# Patient Record
Sex: Male | Born: 1995 | Race: Black or African American | Hispanic: No | Marital: Single | Smoking: Never smoker
Health system: Southern US, Community
[De-identification: ages and names within clinical notes are randomized; demographics above are authoritative.]

---
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
## 2018-11-15 ENCOUNTER — Encounter (HOSPITAL_COMMUNITY): Payer: Self-pay | Admitting: Emergency Medicine

## 2018-11-15 ENCOUNTER — Ambulatory Visit (HOSPITAL_COMMUNITY)
Admission: EM | Admit: 2018-11-15 | Discharge: 2018-11-15 | Disposition: A | Discharge status: Home / Self Care | Payer: Self-pay | Attending: Family Medicine | Admitting: Family Medicine

## 2018-11-15 DIAGNOSIS — J320 Chronic maxillary sinusitis: Secondary | ICD-10-CM | POA: Insufficient documentation

## 2018-11-15 MED ORDER — AZITHROMYCIN 250 MG PO TABS: 250.0000 mg | ORAL_TABLET | Freq: Once | ORAL | 1 refills | Status: AC

## 2018-11-15 NOTE — ED Provider Notes (Signed)
[facility]    CSN: [id number] Arrival date & time: 11/15/18  1728     History   Chief Complaint Chief Complaint  Patient presents with  . URI    appt 1745    HPI Adric Wrede is a 22 y.o. male.  Patient complains of sinus pain and pressure, postnasal drainage and cough productive of dark yellow sputum.  Symptoms are improved with hot shower.  He has been taking some Claritin.   HPI  Past Medical History:  Diagnosis Date  . Asthma   . Migraine     There are no active problems to display for this patient.   History reviewed. No pertinent surgical history.     Home Medications    Prior to Admission medications   Medication Sig Start Date End Date Taking? Authorizing Provider  albuterol (PROVENTIL,VENTOLIN) 90 MCG/ACT inhaler Inhale 1-2 puffs into the lungs every 4 (four) hours as needed.      [provider]  budesonide-formoterol (SYMBICORT) 160-4.5 MCG/ACT inhaler Inhale 2 puffs into the lungs 2 (two) times daily.      [provider]  desloratadine (CLARINEX) 5 MG tablet Take 5 mg by mouth daily.      [provider]  diphenhydramine-acetaminophen (TYLENOL PM) 25-500 MG TABS Take 1 tablet by mouth at bedtime as needed.    [provider]  levalbuterol Pauline Aus) 0.63 MG/3ML nebulizer solution Take 1 ampule by nebulization every 4 (four) hours as needed.      [provider]  mometasone (NASONEX) 50 MCG/ACT nasal spray Place 2 sprays into the nose daily.      [provider]  mupirocin ointment (BACTROBAN) 2 % Apply topically 3 (three) times daily. 08/07/13   Reuben Likes, MD  sulfamethoxazole-trimethoprim (BACTRIM DS) 800-160 MG per tablet Take 2 tablets by mouth 2 (two) times daily. Patient not taking: Reported on 11/15/2018 08/07/13   Reuben Likes, MD    Family History History reviewed. No pertinent family history.  Social History Social History   Tobacco Use  . Smoking status: Never  Smoker  . Smokeless tobacco: Never Used  Substance Use Topics  . Alcohol use: No  . Drug use: No     Allergies   Penicillins   Review of Systems Review of Systems  Constitutional: Negative.   HENT: Positive for congestion, sinus pressure, sinus pain and sore throat.   Respiratory: Positive for cough.   Cardiovascular: Negative.   Gastrointestinal: Negative.      Physical Exam Triage Vital Signs ED Triage Vitals  Enc Vitals Group     BP 11/15/18 1754 (!) 146/84     Pulse Rate 11/15/18 1754 (!) 104     Resp 11/15/18 1754 18     Temp 11/15/18 1754 (!) 100.6 F (38.1 C)     Temp Source 11/15/18 1754 Oral     SpO2 11/15/18 1754 96 %     Weight --      Height --      Head Circumference --      Peak Flow --      Pain Score 11/15/18 1755 5     Pain Loc --      Pain Edu? --      Excl. in GC? --    No data found.  Updated Vital Signs BP (!) 146/84 (BP Location: Right Arm)   Pulse (!) 104   Temp (!) 100.6 F (38.1 C) (Oral)   Resp 18   SpO2  96%   Visual Acuity Right Eye Distance:   Left Eye Distance:   Bilateral Distance:    Right Eye Near:   Left Eye Near:    Bilateral Near:     Physical Exam Vitals signs and nursing note reviewed.  Constitutional:      Appearance: Normal appearance. He is obese.  HENT:     Head: Normocephalic.     Comments: There is maxillary sinus tenderness bilaterally    Right Ear: Tympanic membrane normal.     Left Ear: Tympanic membrane normal.     Nose: Nose normal.     Mouth/Throat:     Mouth: Mucous membranes are moist.  Cardiovascular:     Rate and Rhythm: Normal rate.  Pulmonary:     Effort: Pulmonary effort is normal.     Breath sounds: Normal breath sounds.  Neurological:     Mental Status: He is alert.      UC Treatments / Results  Labs (all labs ordered are listed, but only abnormal results are displayed) Labs Reviewed - No data to display  EKG None  Radiology No results found.  Procedures Procedures  (including critical care time)  Medications Ordered in UC Medications - No data to display  Initial Impression / Assessment and Plan / UC Course  I have reviewed the triage vital signs and the nursing notes.  Pertinent labs & imaging results that were available during my care of the patient were reviewed by me and considered in my medical decision making (see chart for details).     Maxillary sinusitis.  Have recommended plenty of fluids hot showers discontinue Claritin.  Begin Mucinex Final Clinical Impressions(s) / UC Diagnoses   Final diagnoses:  None   Discharge Instructions   None    ED Prescriptions    None     Controlled Substance Prescriptions [place] Controlled Substance Registry consulted? No   Frederica KusterMiller, Stephen M, MD 11/15/18 1806

## 2018-11-15 NOTE — ED Triage Notes (Signed)
Pt here for chills and body aches; pt noted to have fever currently; pt sts URI sx

## 2019-06-12 ENCOUNTER — Other Ambulatory Visit: Payer: Self-pay | Admitting: *Deleted

## 2019-06-12 DIAGNOSIS — Z20822 Contact with and (suspected) exposure to covid-19: Secondary | ICD-10-CM

## 2019-06-13 NOTE — Addendum Note (Signed)
Addended by: Calina Patrie M on: 06/13/2019 09:01 PM   Modules accepted: Orders  

## 2021-08-24 ENCOUNTER — Encounter (HOSPITAL_BASED_OUTPATIENT_CLINIC_OR_DEPARTMENT_OTHER): Payer: Self-pay | Admitting: Emergency Medicine

## 2021-08-24 ENCOUNTER — Emergency Department (HOSPITAL_BASED_OUTPATIENT_CLINIC_OR_DEPARTMENT_OTHER)
Admission: EM | Admit: 2021-08-24 | Discharge: 2021-08-24 | Disposition: A | Discharge status: Home / Self Care | Payer: Managed Care, Other (non HMO) | Attending: Emergency Medicine | Admitting: Emergency Medicine

## 2021-08-24 ENCOUNTER — Emergency Department (HOSPITAL_BASED_OUTPATIENT_CLINIC_OR_DEPARTMENT_OTHER): Payer: Managed Care, Other (non HMO)

## 2021-08-24 ENCOUNTER — Other Ambulatory Visit: Payer: Self-pay

## 2021-08-24 DIAGNOSIS — S60812A Abrasion of left wrist, initial encounter: Secondary | ICD-10-CM | POA: Diagnosis not present

## 2021-08-24 DIAGNOSIS — S60512A Abrasion of left hand, initial encounter: Secondary | ICD-10-CM

## 2021-08-24 DIAGNOSIS — J45909 Unspecified asthma, uncomplicated: Secondary | ICD-10-CM | POA: Diagnosis not present

## 2021-08-24 DIAGNOSIS — S60414A Abrasion of right ring finger, initial encounter: Secondary | ICD-10-CM | POA: Diagnosis not present

## 2021-08-24 NOTE — ED Notes (Signed)
EDP at Bedside 

## 2021-08-24 NOTE — ED Notes (Signed)
This RN presented the AVS utilizing Teachback Method. Patient verbalizes understanding of Discharge Instructions. Opportunity for Questioning and Answers were provided. Patient Discharged from ED ambulatory to Home with Mother.   

## 2021-08-24 NOTE — Discharge Instructions (Addendum)
Use Neosporin or triple antibiotic ointment to your abrasions.  Please keep clean.  You may gently cleanse with soap and water.  Monitor the area for signs of infection which includes redness or swelling to the area.
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
## 2022-07-05 ENCOUNTER — Emergency Department (HOSPITAL_BASED_OUTPATIENT_CLINIC_OR_DEPARTMENT_OTHER): Payer: Self-pay | Admitting: Anesthesiology

## 2022-07-05 ENCOUNTER — Encounter (HOSPITAL_BASED_OUTPATIENT_CLINIC_OR_DEPARTMENT_OTHER): Payer: Self-pay | Admitting: *Deleted

## 2022-07-05 ENCOUNTER — Emergency Department (HOSPITAL_BASED_OUTPATIENT_CLINIC_OR_DEPARTMENT_OTHER): Payer: Self-pay

## 2022-07-05 ENCOUNTER — Emergency Department (HOSPITAL_COMMUNITY): Payer: Self-pay | Admitting: Anesthesiology

## 2022-07-05 ENCOUNTER — Encounter (HOSPITAL_BASED_OUTPATIENT_CLINIC_OR_DEPARTMENT_OTHER): Payer: Self-pay | Admitting: Student

## 2022-07-05 ENCOUNTER — Ambulatory Visit (HOSPITAL_BASED_OUTPATIENT_CLINIC_OR_DEPARTMENT_OTHER)
Admission: EM | Admit: 2022-07-05 | Discharge: 2022-07-05 | Disposition: A | Discharge status: Home / Self Care | Payer: Self-pay | Attending: Surgery | Admitting: Surgery

## 2022-07-05 ENCOUNTER — Encounter (HOSPITAL_COMMUNITY)
Admission: EM | Disposition: A | Discharge status: Home / Self Care | Payer: Self-pay | Source: Home / Self Care | Attending: Emergency Medicine

## 2022-07-05 ENCOUNTER — Ambulatory Visit: Admit: 2022-07-05 | Payer: Medicaid Other | Admitting: Surgery

## 2022-07-05 ENCOUNTER — Other Ambulatory Visit: Payer: Self-pay

## 2022-07-05 DIAGNOSIS — K3589 Other acute appendicitis without perforation or gangrene: Secondary | ICD-10-CM | POA: Insufficient documentation

## 2022-07-05 DIAGNOSIS — Z6838 Body mass index (BMI) 38.0-38.9, adult: Secondary | ICD-10-CM | POA: Insufficient documentation

## 2022-07-05 DIAGNOSIS — E669 Obesity, unspecified: Secondary | ICD-10-CM | POA: Insufficient documentation

## 2022-07-05 DIAGNOSIS — R1031 Right lower quadrant pain: Secondary | ICD-10-CM

## 2022-07-05 DIAGNOSIS — K3580 Unspecified acute appendicitis: Secondary | ICD-10-CM

## 2022-07-05 DIAGNOSIS — R112 Nausea with vomiting, unspecified: Secondary | ICD-10-CM

## 2022-07-05 DIAGNOSIS — J45909 Unspecified asthma, uncomplicated: Secondary | ICD-10-CM | POA: Insufficient documentation

## 2022-07-05 HISTORY — PX: LAPAROSCOPIC APPENDECTOMY: SHX408

## 2022-07-05 LAB — CBC WITH DIFFERENTIAL/PLATELET
Abs Immature Granulocytes: 0.05 10*3/uL (ref 0.00–0.07)
Basophils Absolute: 0 10*3/uL (ref 0.0–0.1)
Basophils Relative: 0 %
Eosinophils Absolute: 0 10*3/uL (ref 0.0–0.5)
Eosinophils Relative: 0 %
HCT: 40.7 % (ref 39.0–52.0)
Hemoglobin: 14.2 g/dL (ref 13.0–17.0)
Immature Granulocytes: 0 %
Lymphocytes Relative: 7 %
Lymphs Abs: 0.9 10*3/uL (ref 0.7–4.0)
MCH: 31 pg (ref 26.0–34.0)
MCHC: 34.9 g/dL (ref 30.0–36.0)
MCV: 88.9 fL (ref 80.0–100.0)
Monocytes Absolute: 0.7 10*3/uL (ref 0.1–1.0)
Monocytes Relative: 5 %
Neutro Abs: 11.9 10*3/uL — ABNORMAL HIGH (ref 1.7–7.7)
Neutrophils Relative %: 88 %
Platelets: 279 10*3/uL (ref 150–400)
RBC: 4.58 MIL/uL (ref 4.22–5.81)
RDW: 11.4 % — ABNORMAL LOW (ref 11.5–15.5)
WBC: 13.6 10*3/uL — ABNORMAL HIGH (ref 4.0–10.5)
nRBC: 0 % (ref 0.0–0.2)

## 2022-07-05 LAB — BASIC METABOLIC PANEL
Anion gap: 7 (ref 5–15)
BUN: 9 mg/dL (ref 6–20)
CO2: 25 mmol/L (ref 22–32)
Calcium: 9 mg/dL (ref 8.9–10.3)
Chloride: 107 mmol/L (ref 98–111)
Creatinine, Ser: 0.84 mg/dL (ref 0.61–1.24)
GFR, Estimated: >60 mL/min (ref >60)
Glucose, Bld: 106 mg/dL — ABNORMAL HIGH (ref 70–99)
Potassium: 3.3 mmol/L — ABNORMAL LOW (ref 3.5–5.1)
Sodium: 139 mmol/L (ref 135–145)

## 2022-07-05 LAB — URINALYSIS, ROUTINE W REFLEX MICROSCOPIC
Glucose, UA: NEGATIVE mg/dL
Hgb urine dipstick: NEGATIVE
Ketones, ur: >=80 mg/dL — AB
Leukocytes,Ua: NEGATIVE
Nitrite: NEGATIVE
Protein, ur: NEGATIVE mg/dL
Specific Gravity, Urine: 1.025 (ref 1.005–1.030)
pH: 7 (ref 5.0–8.0)

## 2022-07-05 LAB — LIPASE, BLOOD: Lipase: 35 U/L (ref 11–51)

## 2022-07-05 SURGERY — APPENDECTOMY, LAPAROSCOPIC
Anesthesia: General | Site: Abdomen

## 2022-07-05 MED ORDER — FENTANYL CITRATE PF 50 MCG/ML IJ SOSY
50.0000 ug | PREFILLED_SYRINGE | Freq: Once | INTRAMUSCULAR | Status: AC
  Administered 2022-07-05: 50 ug via INTRAVENOUS
  Filled 2022-07-05: qty 1

## 2022-07-05 MED ORDER — METRONIDAZOLE 500 MG/100ML IV SOLN
500.0000 mg | Freq: Once | INTRAVENOUS | Status: AC
  Administered 2022-07-05: 500 mg via INTRAVENOUS
  Filled 2022-07-05: qty 100

## 2022-07-05 MED ORDER — ONDANSETRON HCL 4 MG/2ML IJ SOLN
INTRAMUSCULAR | Status: DC | PRN
  Administered 2022-07-05: 4 mg via INTRAVENOUS

## 2022-07-05 MED ORDER — PROPOFOL 10 MG/ML IV BOLUS
INTRAVENOUS | Status: AC
  Filled 2022-07-05: qty 20

## 2022-07-05 MED ORDER — FENTANYL CITRATE (PF) 100 MCG/2ML IJ SOLN
INTRAMUSCULAR | Status: AC
  Filled 2022-07-05: qty 2

## 2022-07-05 MED ORDER — ACETAMINOPHEN 500 MG PO TABS
1000.0000 mg | ORAL_TABLET | Freq: Once | ORAL | Status: AC
  Administered 2022-07-05: 1000 mg via ORAL
  Filled 2022-07-05: qty 2

## 2022-07-05 MED ORDER — ROCURONIUM BROMIDE 10 MG/ML (PF) SYRINGE
PREFILLED_SYRINGE | INTRAVENOUS | Status: AC
  Filled 2022-07-05: qty 10

## 2022-07-05 MED ORDER — ONDANSETRON HCL 4 MG/2ML IJ SOLN
INTRAMUSCULAR | Status: AC
  Filled 2022-07-05: qty 2

## 2022-07-05 MED ORDER — SUGAMMADEX SODIUM 500 MG/5ML IV SOLN
INTRAVENOUS | Status: DC | PRN
  Administered 2022-07-05: 300 mg via INTRAVENOUS

## 2022-07-05 MED ORDER — 0.9 % SODIUM CHLORIDE (POUR BTL) OPTIME
TOPICAL | Status: DC | PRN
  Administered 2022-07-05: 1000 mL

## 2022-07-05 MED ORDER — BUPIVACAINE-EPINEPHRINE 0.25% -1:200000 IJ SOLN
INTRAMUSCULAR | Status: DC | PRN
  Administered 2022-07-05: 30 mL

## 2022-07-05 MED ORDER — OXYCODONE HCL 5 MG/5ML PO SOLN: 5.0000 mg | Freq: Once | ORAL | Status: DC | PRN

## 2022-07-05 MED ORDER — METRONIDAZOLE 500 MG/100ML IV SOLN
INTRAVENOUS | Status: DC | PRN
  Administered 2022-07-05: 500 mg via INTRAVENOUS

## 2022-07-05 MED ORDER — MIDAZOLAM HCL 2 MG/2ML IJ SOLN
INTRAMUSCULAR | Status: AC
  Filled 2022-07-05: qty 2

## 2022-07-05 MED ORDER — BUPIVACAINE-EPINEPHRINE (PF) 0.25% -1:200000 IJ SOLN
INTRAMUSCULAR | Status: AC
  Filled 2022-07-05: qty 30

## 2022-07-05 MED ORDER — SUCCINYLCHOLINE CHLORIDE 200 MG/10ML IV SOSY
PREFILLED_SYRINGE | INTRAVENOUS | Status: AC
  Filled 2022-07-05: qty 10

## 2022-07-05 MED ORDER — ONDANSETRON HCL 4 MG/2ML IJ SOLN
4.0000 mg | Freq: Once | INTRAMUSCULAR | Status: AC
  Administered 2022-07-05: 4 mg via INTRAVENOUS
  Filled 2022-07-05: qty 2

## 2022-07-05 MED ORDER — DEXMEDETOMIDINE (PRECEDEX) IN NS 20 MCG/5ML (4 MCG/ML) IV SYRINGE
PREFILLED_SYRINGE | INTRAVENOUS | Status: DC | PRN
  Administered 2022-07-05: 4 ug via INTRAVENOUS
  Administered 2022-07-05: 12 ug via INTRAVENOUS
  Administered 2022-07-05: 4 ug via INTRAVENOUS

## 2022-07-05 MED ORDER — LIDOCAINE HCL (PF) 2 % IJ SOLN
INTRAMUSCULAR | Status: AC
  Filled 2022-07-05: qty 5

## 2022-07-05 MED ORDER — LACTATED RINGERS IV SOLN: INTRAVENOUS | Status: DC

## 2022-07-05 MED ORDER — LIDOCAINE HCL (CARDIAC) PF 100 MG/5ML IV SOSY
PREFILLED_SYRINGE | INTRAVENOUS | Status: DC | PRN
  Administered 2022-07-05: 100 mg via INTRAVENOUS

## 2022-07-05 MED ORDER — ONDANSETRON HCL 4 MG/2ML IJ SOLN: 4.0000 mg | INTRAMUSCULAR | Status: DC | PRN

## 2022-07-05 MED ORDER — MORPHINE SULFATE (PF) 2 MG/ML IV SOLN: 1.0000 mg | INTRAVENOUS | Status: DC | PRN

## 2022-07-05 MED ORDER — FENTANYL CITRATE (PF) 100 MCG/2ML IJ SOLN
INTRAMUSCULAR | Status: DC | PRN
  Administered 2022-07-05: 50 ug via INTRAVENOUS
  Administered 2022-07-05: 100 ug via INTRAVENOUS
  Administered 2022-07-05: 50 ug via INTRAVENOUS

## 2022-07-05 MED ORDER — TRAMADOL HCL 50 MG PO TABS: 50.0000 mg | ORAL_TABLET | Freq: Four times a day (QID) | ORAL | 0 refills | Status: DC | PRN

## 2022-07-05 MED ORDER — MIDAZOLAM HCL 5 MG/5ML IJ SOLN
INTRAMUSCULAR | Status: DC | PRN
  Administered 2022-07-05: 2 mg via INTRAVENOUS

## 2022-07-05 MED ORDER — IBUPROFEN 800 MG PO TABS: 800.0000 mg | ORAL_TABLET | Freq: Three times a day (TID) | ORAL | 0 refills | Status: DC | PRN

## 2022-07-05 MED ORDER — SUGAMMADEX SODIUM 500 MG/5ML IV SOLN
INTRAVENOUS | Status: AC
Start: 2022-07-05 — End: ?
  Filled 2022-07-05: qty 5

## 2022-07-05 MED ORDER — SODIUM CHLORIDE 0.9 % IV BOLUS
1000.0000 mL | Freq: Once | INTRAVENOUS | Status: AC
  Administered 2022-07-05: 1000 mL via INTRAVENOUS

## 2022-07-05 MED ORDER — ONDANSETRON HCL 4 MG/2ML IJ SOLN: 4.0000 mg | Freq: Once | INTRAMUSCULAR | Status: DC | PRN

## 2022-07-05 MED ORDER — IOHEXOL 300 MG/ML  SOLN
100.0000 mL | Freq: Once | INTRAMUSCULAR | Status: AC | PRN
  Administered 2022-07-05: 100 mL via INTRAVENOUS

## 2022-07-05 MED ORDER — FENTANYL CITRATE PF 50 MCG/ML IJ SOSY: 25.0000 ug | PREFILLED_SYRINGE | INTRAMUSCULAR | Status: DC | PRN

## 2022-07-05 MED ORDER — AMISULPRIDE (ANTIEMETIC) 5 MG/2ML IV SOLN: 10.0000 mg | Freq: Once | INTRAVENOUS | Status: DC | PRN

## 2022-07-05 MED ORDER — DEXAMETHASONE SODIUM PHOSPHATE 10 MG/ML IJ SOLN
INTRAMUSCULAR | Status: AC
  Filled 2022-07-05: qty 1

## 2022-07-05 MED ORDER — DEXAMETHASONE SODIUM PHOSPHATE 10 MG/ML IJ SOLN
INTRAMUSCULAR | Status: DC | PRN
  Administered 2022-07-05: 4 mg via INTRAVENOUS

## 2022-07-05 MED ORDER — CIPROFLOXACIN IN D5W 400 MG/200ML IV SOLN
400.0000 mg | Freq: Once | INTRAVENOUS | Status: AC
  Administered 2022-07-05: 400 mg via INTRAVENOUS
  Filled 2022-07-05: qty 200

## 2022-07-05 MED ORDER — ROCURONIUM BROMIDE 100 MG/10ML IV SOLN
INTRAVENOUS | Status: DC | PRN
  Administered 2022-07-05: 55 mg via INTRAVENOUS
  Administered 2022-07-05: 5 mg via INTRAVENOUS
  Administered 2022-07-05: 10 mg via INTRAVENOUS

## 2022-07-05 MED ORDER — OXYCODONE HCL 5 MG PO TABS: 5.0000 mg | ORAL_TABLET | Freq: Once | ORAL | Status: DC | PRN

## 2022-07-05 MED ORDER — ORAL CARE MOUTH RINSE: 15.0000 mL | Freq: Once | OROMUCOSAL | Status: AC

## 2022-07-05 MED ORDER — METRONIDAZOLE 500 MG/100ML IV SOLN
INTRAVENOUS | Status: AC
  Filled 2022-07-05: qty 100

## 2022-07-05 MED ORDER — CHLORHEXIDINE GLUCONATE 0.12 % MT SOLN
15.0000 mL | Freq: Once | OROMUCOSAL | Status: AC
  Administered 2022-07-05: 15 mL via OROMUCOSAL

## 2022-07-05 MED ORDER — PROPOFOL 10 MG/ML IV BOLUS
INTRAVENOUS | Status: DC | PRN
  Administered 2022-07-05: 170 mg via INTRAVENOUS

## 2022-07-05 MED ORDER — LACTATED RINGERS IR SOLN
Status: DC | PRN
  Administered 2022-07-05: 1000 mL

## 2022-07-05 MED ORDER — ONDANSETRON HCL 4 MG PO TABS: 4.0000 mg | ORAL_TABLET | Freq: Every day | ORAL | 1 refills | Status: AC | PRN

## 2022-07-05 SURGICAL SUPPLY — 51 items
APPLIER CLIP 5 13 M/L LIGAMAX5 (MISCELLANEOUS) ×2
APPLIER CLIP ROT 10 11.4 M/L (STAPLE)
BAG COUNTER SPONGE SURGICOUNT (BAG) IMPLANT
CABLE HIGH FREQUENCY MONO STRZ (ELECTRODE) IMPLANT
CHLORAPREP W/TINT 26 (MISCELLANEOUS) ×2 IMPLANT
CLIP APPLIE 5 13 M/L LIGAMAX5 (MISCELLANEOUS) IMPLANT
CLIP APPLIE ROT 10 11.4 M/L (STAPLE) IMPLANT
COVER SURGICAL LIGHT HANDLE (MISCELLANEOUS) ×2 IMPLANT
CUTTER FLEX LINEAR 45M (STAPLE) ×1 IMPLANT
DERMABOND ADVANCED (GAUZE/BANDAGES/DRESSINGS) ×1
DERMABOND ADVANCED .7 DNX12 (GAUZE/BANDAGES/DRESSINGS) ×1 IMPLANT
DRAIN CHANNEL 19F RND (DRAIN) IMPLANT
ELECT REM PT RETURN 15FT ADLT (MISCELLANEOUS) ×2 IMPLANT
ENDOLOOP SUT PDS II  0 18 (SUTURE)
ENDOLOOP SUT PDS II 0 18 (SUTURE) IMPLANT
EVACUATOR SILICONE 100CC (DRAIN) IMPLANT
GLOVE BIO SURGEON STRL SZ 6 (GLOVE) ×2 IMPLANT
GLOVE INDICATOR 6.5 STRL GRN (GLOVE) ×2 IMPLANT
GLOVE SS BIOGEL STRL SZ 6 (GLOVE) ×1 IMPLANT
GLOVE SUPERSENSE BIOGEL SZ 6 (GLOVE) ×1
GOWN STRL REUS W/ TWL LRG LVL3 (GOWN DISPOSABLE) ×1 IMPLANT
GOWN STRL REUS W/ TWL XL LVL3 (GOWN DISPOSABLE) IMPLANT
GOWN STRL REUS W/TWL LRG LVL3 (GOWN DISPOSABLE) ×1
GOWN STRL REUS W/TWL XL LVL3 (GOWN DISPOSABLE)
GRASPER SUT TROCAR 14GX15 (MISCELLANEOUS) ×1 IMPLANT
IRRIG SUCT STRYKERFLOW 2 WTIP (MISCELLANEOUS) ×2
IRRIGATION SUCT STRKRFLW 2 WTP (MISCELLANEOUS) ×1 IMPLANT
KIT BASIN OR (CUSTOM PROCEDURE TRAY) ×2 IMPLANT
KIT TURNOVER KIT A (KITS) ×1 IMPLANT
NDL INSUFFLATION 14GA 120MM (NEEDLE) ×1 IMPLANT
NEEDLE INSUFFLATION 14GA 120MM (NEEDLE) ×2 IMPLANT
RELOAD 45 VASCULAR/THIN (ENDOMECHANICALS) IMPLANT
RELOAD STAPLE 45 2.5 WHT GRN (ENDOMECHANICALS) IMPLANT
RELOAD STAPLE 45 3.5 BLU ETS (ENDOMECHANICALS) IMPLANT
RELOAD STAPLE TA45 3.5 REG BLU (ENDOMECHANICALS) ×2 IMPLANT
SCISSORS LAP 5X35 DISP (ENDOMECHANICALS) IMPLANT
SET TUBE SMOKE EVAC HIGH FLOW (TUBING) ×2 IMPLANT
SHEARS HARMONIC ACE PLUS 36CM (ENDOMECHANICALS) ×2 IMPLANT
SLEEVE Z-THREAD 5X100MM (TROCAR) ×2 IMPLANT
SPIKE FLUID TRANSFER (MISCELLANEOUS) ×1 IMPLANT
SUT ETHILON 2 0 PS N (SUTURE) IMPLANT
SUT MNCRL AB 4-0 PS2 18 (SUTURE) ×2 IMPLANT
SYS BAG RETRIEVAL 10MM (BASKET) ×2
SYSTEM BAG RETRIEVAL 10MM (BASKET) ×1 IMPLANT
TOWEL OR 17X26 10 PK STRL BLUE (TOWEL DISPOSABLE) ×2 IMPLANT
TOWEL OR NON WOVEN STRL DISP B (DISPOSABLE) ×2 IMPLANT
TRAY FOLEY MTR SLVR 14FR STAT (SET/KITS/TRAYS/PACK) IMPLANT
TRAY FOLEY MTR SLVR 16FR STAT (SET/KITS/TRAYS/PACK) ×1 IMPLANT
TRAY LAPAROSCOPIC (CUSTOM PROCEDURE TRAY) ×2 IMPLANT
TROCAR ADV FIXATION 12X100MM (TROCAR) ×2 IMPLANT
TROCAR Z-THREAD OPTICAL 5X100M (TROCAR) ×2 IMPLANT

## 2022-07-05 NOTE — Anesthesia Preprocedure Evaluation (Addendum)
Anesthesia Evaluation  Patient identified by MRN, date of birth, ID band Patient awake    Reviewed: Allergy & Precautions, NPO status , Patient's Chart, lab work & pertinent test results  Airway Mallampati: II  TM Distance: >3 FB Neck ROM: Full    Dental no notable dental hx. (+) Teeth Intact, Dental Advisory Given   Pulmonary asthma , Patient abstained from smoking.,    Pulmonary exam normal breath sounds clear to auscultation       Cardiovascular Exercise Tolerance: Good Normal cardiovascular exam Rhythm:Regular Rate:Normal     Neuro/Psych  Headaches,    GI/Hepatic   Endo/Other    Renal/GU      Musculoskeletal   Abdominal (+) + obese (BMI 38.8),   Peds  Hematology Lab Results      Component                Value               Date                      WBC                      13.6 (H)            07/05/2022                HGB                      14.2                07/05/2022                HCT                      40.7                07/05/2022                MCV                      88.9                07/05/2022                PLT                      279                 07/05/2022              Anesthesia Other Findings All: PCN  Reproductive/Obstetrics                            Anesthesia Physical Anesthesia Plan  ASA: 2 and emergent  Anesthesia Plan: General   Post-op Pain Management: Toradol IV (intra-op)* and Tylenol PO (pre-op)*   Induction: Intravenous, Cricoid pressure planned and Rapid sequence  PONV Risk Score and Plan: 3 and Treatment may vary due to age or medical condition, Ondansetron and Midazolam  Airway Management Planned: Oral ETT  Additional Equipment: None  Intra-op Plan:   Post-operative Plan: Extubation in OR  Informed Consent: I have reviewed the patients History and Physical, chart, labs and discussed the procedure including the risks, benefits and  alternatives for the proposed anesthesia with the patient or authorized representative who  has indicated his/her understanding and acceptance.     Dental advisory given  Plan Discussed with:   Anesthesia Plan Comments:        Anesthesia Quick Evaluation

## 2022-07-05 NOTE — Discharge Instructions (Signed)
[facility] ° °Please arrive at least 30 min before your appointment to complete your check in paperwork.  If you are unable to arrive 30 min prior to your appointment time we may have to cancel or reschedule you. °LAPAROSCOPIC SURGERY: POST OP INSTRUCTIONS °Always review your discharge instruction sheet given to you by the facility where your surgery was performed. °IF YOU HAVE DISABILITY OR FAMILY LEAVE FORMS, YOU MUST BRING THEM TO THE OFFICE FOR PROCESSING.   °DO NOT GIVE THEM TO YOUR DOCTOR. ° °PAIN CONTROL ° °First take acetaminophen (Tylenol) AND/or ibuprofen (Advil) to control your pain after surgery.  Follow directions on package.  Taking acetaminophen (Tylenol) and/or ibuprofen (Advil) regularly after surgery will help to control your pain and lower the amount of prescription pain medication you may need.  You should not take more than 4,000 mg (4 grams) of acetaminophen (Tylenol) in 24 hours.  You should not take ibuprofen (Advil), aleve, motrin, naprosyn or other NSAIDS if you have a history of stomach ulcers or chronic kidney disease.  °A prescription for pain medication may be given to you upon discharge.  Take your pain medication as prescribed, if you still have uncontrolled pain after taking acetaminophen (Tylenol) or ibuprofen (Advil). °Use ice packs to help control pain. °If you need a refill on your pain medication, please contact your pharmacy.  They will contact our office to request authorization. Prescriptions will not be filled after 5pm or on week-ends. ° °HOME MEDICATIONS °Take your usually prescribed medications unless otherwise directed. ° °DIET °You should follow a light diet the first few days after arrival home.  Be sure to include lots of fluids daily. Avoid fatty, fried foods.  ° °CONSTIPATION °It is common to experience some constipation after surgery and if you are taking pain medication.  Increasing fluid intake and taking a stool softener (such as Colace)  will usually help or prevent this problem from occurring.  A mild laxative (Milk of Magnesia or Miralax) should be taken according to package instructions if there are no bowel movements after 48 hours. ° °WOUND/INCISION CARE °Most patients will experience some swelling and bruising in the area of the incisions.  Ice packs will help.  Swelling and bruising can take several days to resolve.  °Unless discharge instructions indicate otherwise, follow guidelines below  °STERI-STRIPS - you may remove your outer bandages 48 hours after surgery, and you may shower at that time.  You have steri-strips (small skin tapes) in place directly over the incision.  These strips should be left on the skin for 7-10 days.   °DERMABOND/SKIN GLUE - you may shower in 24 hours.  The glue will flake off over the next 2-3 weeks. °Any sutures or staples will be removed at the office during your follow-up visit. ° °ACTIVITIES °You may resume regular (light) daily activities beginning the next day--such as daily self-care, walking, climbing stairs--gradually increasing activities as tolerated.  You may have sexual intercourse when it is comfortable.  Refrain from any heavy lifting or straining until approved by your doctor. °You may drive when you are no longer taking prescription pain medication, you can comfortably wear a seatbelt, and you can safely maneuver your car and apply brakes. ° °FOLLOW-UP °You should see your doctor in the office for a follow-up appointment approximately 2-3 weeks after your surgery.  You should have been given your post-op/follow-up appointment when your surgery was scheduled.  If you did not receive a post-op/follow-up appointment, make sure   that you call for this appointment within a day or two after you arrive home to insure a convenient appointment time. ° ° °WHEN TO CALL YOUR DOCTOR: °Fever over 101.0 °Inability to urinate °Continued bleeding from incision. °Increased pain, redness, or drainage from the  incision. °Increasing abdominal pain ° °The clinic staff is available to answer your questions during regular business hours.  Please don’t hesitate to call and ask to speak to one of the nurses for clinical concerns.  If you have a medical emergency, go to the nearest emergency room or call 911.  A surgeon from [facility] is always on call at the hospital. °[address], [address], [place], [place]  [address] ? [address] [address], [place], [place]   [address] °[phone] ? [phone] ? FAX [phone] ° ° ° ° °Managing Your Pain After Surgery Without Opioids ° ° ° °Thank you for participating in our program to help patients manage their pain after surgery without opioids. This is part of our effort to provide you with the best care possible, without exposing you or your family to the risk that opioids pose. ° °What pain can I expect after surgery? °You can expect to have some pain after surgery. This is normal. The pain is typically worse the day after surgery, and quickly begins to get better. °Many studies have found that many patients are able to manage their pain after surgery with Over-the-Counter (OTC) medications such as Tylenol and Motrin. If you have a condition that does not allow you to take Tylenol or Motrin, notify your surgical team. ° °How will I manage my pain? °The best strategy for controlling your pain after surgery is around the clock pain control with Tylenol (acetaminophen) and Motrin (ibuprofen or Advil). Alternating these medications with each other allows you to maximize your pain control. In addition to Tylenol and Motrin, you can use heating pads or ice packs on your incisions to help reduce your pain. ° °How will I alternate your regular strength over-the-counter pain medication? °You will take a dose of pain medication every three hours. °Start by taking 650 mg of Tylenol (2 pills of 325 mg) °3 hours later take 600 mg of Motrin (3 pills of 200 mg) °3 hours after  taking the Motrin take 650 mg of Tylenol °3 hours after that take 600 mg of Motrin. ° ° °- 1 - ° °See example - if your first dose of Tylenol is at 12:00 PM ° ° °12:00 PM Tylenol 650 mg (2 pills of 325 mg)  °3:00 PM Motrin 600 mg (3 pills of 200 mg)  °6:00 PM Tylenol 650 mg (2 pills of 325 mg)  °9:00 PM Motrin 600 mg (3 pills of 200 mg)  °Continue alternating every 3 hours  ° °We recommend that you follow this schedule around-the-clock for at least 3 days after surgery, or until you feel that it is no longer needed. Use the table on the last page of this handout to keep track of the medications you are taking. °Important: °Do not take more than 3000mg of Tylenol or 3200mg of Motrin in a 24-hour period. °Do not take ibuprofen/Motrin if you have a history of bleeding stomach ulcers, severe kidney disease, &/or actively taking a blood thinner ° °What if I still have pain? °If you have pain that is not controlled with the over-the-counter pain medications (Tylenol and Motrin or Advil) you might have what we call “breakthrough” pain. You will receive a prescription   for a small amount of an opioid pain medication such as Oxycodone, Tramadol, or Tylenol with Codeine. Use these opioid pills in the first 24 hours after surgery if you have breakthrough pain. Do not take more than 1 pill every 4-6 hours. ° °If you still have uncontrolled pain after using all opioid pills, don't hesitate to call our staff using the number provided. We will help make sure you are managing your pain in the best way possible, and if necessary, we can provide a prescription for additional pain medication. ° ° °Day 1   ° °Time  °Name of Medication Number of pills taken  °Amount of Acetaminophen  °Pain Level  ° °Comments  °AM PM       °AM PM       °AM PM       °AM PM       °AM PM       °AM PM       °AM PM       °AM PM       °Total Daily amount of Acetaminophen °Do not take more than  3,000 mg per day    ° ° °Day 2   ° °Time  °Name of Medication  Number of pills °taken  °Amount of Acetaminophen  °Pain Level  ° °Comments  °AM PM       °AM PM       °AM PM       °AM PM       °AM PM       °AM PM       °AM PM       °AM PM       °Total Daily amount of Acetaminophen °Do not take more than  3,000 mg per day    ° ° °Day 3   ° °Time  °Name of Medication Number of pills taken  °Amount of Acetaminophen  °Pain Level  ° °Comments  °AM PM       °AM PM       °AM PM       °AM PM       ° ° ° °AM PM       °AM PM       °AM PM       °AM PM       °Total Daily amount of Acetaminophen °Do not take more than  3,000 mg per day    ° ° °Day 4   ° °Time  °Name of Medication Number of pills taken  °Amount of Acetaminophen  °Pain Level  ° °Comments  °AM PM       °AM PM       °AM PM       °AM PM       °AM PM       °AM PM       °AM PM       °AM PM       °Total Daily amount of Acetaminophen °Do not take more than  3,000 mg per day    ° ° °Day 5   ° °Time  °Name of Medication Number °of pills taken  °Amount of Acetaminophen  °Pain Level  ° °Comments  °AM PM       °AM PM       °AM PM       °AM PM       °AM PM       °AM   PM       °AM PM       °AM PM       °Total Daily amount of Acetaminophen °Do not take more than  3,000 mg per day    ° ° ° °Day 6   ° °Time  °Name of Medication Number of pills °taken  °Amount of Acetaminophen  °Pain Level  °Comments  °AM PM       °AM PM       °AM PM       °AM PM       °AM PM       °AM PM       °AM PM       °AM PM       °Total Daily amount of Acetaminophen °Do not take more than  3,000 mg per day    ° ° °Day 7   ° °Time  °Name of Medication Number of pills taken  °Amount of Acetaminophen  °Pain Level  ° °Comments  °AM PM       °AM PM       °AM PM       °AM PM       °AM PM       °AM PM       °AM PM       °AM PM       °Total Daily amount of Acetaminophen °Do not take more than  3,000 mg per day    ° ° ° ° °For additional information about how and where to safely dispose of unused opioid °medications - https://www.morepowerfulnc.org ° °Disclaimer: This document  contains information and/or instructional materials adapted from [facility] for the typical patient with your condition. It does not replace medical advice from your health care provider because your experience may differ from that of the °typical patient. Talk to your health care provider if you have any questions about this °document, your condition or your treatment plan. °Adapted from [facility] ° °

## 2022-07-05 NOTE — ED Notes (Signed)
ED Provider at bedside. 

## 2022-07-05 NOTE — ED Triage Notes (Signed)
C/o burning sensation at abdomen, sates has had nausea and vomiting, last vomited this am at approx 5am

## 2022-07-05 NOTE — ED Notes (Signed)
Pt returned from CT °

## 2022-07-05 NOTE — Anesthesia Procedure Notes (Signed)
Procedure Name: Intubation Date/Time: 07/05/2022 2:20 PM  Performed by: [place] Manus Rudd, CRNAPre-anesthesia Checklist: Patient identified, Emergency Drugs available, Suction available and Patient being monitored Patient Re-evaluated:Patient Re-evaluated prior to induction Oxygen Delivery Method: Circle system utilized Preoxygenation: Pre-oxygenation with 100% oxygen Induction Type: IV induction, Rapid sequence and Cricoid Pressure applied Laryngoscope Size: Mac and 4 Grade View: Grade I Tube type: Oral Tube size: 7.5 mm Number of attempts: 1 Airway Equipment and Method: Stylet and Oral airway Placement Confirmation: ETT inserted through vocal cords under direct vision, positive ETCO2 and breath sounds checked- equal and bilateral Secured at: 23 cm Tube secured with: Tape Dental Injury: Teeth and Oropharynx as per pre-operative assessment

## 2022-07-05 NOTE — ED Notes (Signed)
Easily falls asleep during examination and interview

## 2022-07-05 NOTE — ED Notes (Signed)
Wt 133.4kg NPO since 0830hrs today

## 2022-07-05 NOTE — H&P (Signed)
Chris Hull [birth year]  [id number].     Chief Complaint/Reason for Consult: acute appendicitis  HPI:  This is a 26 yo male with a history of asthma and migraines who presented to [facility] ED with RLQ abdominal pain.  This began around 0200am with a pain in the periumbilical/epigastric region.  He developed nausea and vomiting.  The began has since migrated to the RLQ.  No fevers, chills, chest pain, or SOB noted.  He has no other symptoms.  He has been found to have a WBC of 13K and a CT scan consistent with acute appendicitis.  He has been transferred ot [facility] for further surgical management.  ROS: ROS: Please see HPI  History reviewed. No pertinent family history.  Past Medical History:  Diagnosis Date   Asthma    Migraine     History reviewed. No pertinent surgical history.  Social History:  reports that he has never smoked. He has never used smokeless tobacco. He reports that he does not drink alcohol and does not use drugs.  Allergies:  Allergies  Allergen Reactions   Penicillins Hives    (Not in a hospital admission)    Physical Exam: Blood pressure 123/69, pulse 60, temperature 98.6 F (37 C), temperature source Oral, resp. rate 16, weight 133.4 kg, SpO2 98 %. General: pleasant, WD, WN male who is laying in bed in NAD HEENT: head is normocephalic, atraumatic.  Sclera are noninjected.  PERRL.  Ears and nose without any masses or lesions.  Mouth is pink and moist Heart: regular, rate, and rhythm. Palpable radial pulses bilaterally Lungs:   Respiratory effort nonlabored Abd: soft, NT currently secondary to pain medication, ND, +BS, no masses, hernias, or organomegaly MS: all 4 extremities are symmetrical with no cyanosis, clubbing, or edema. Skin: warm and dry with no masses, lesions, or rashes Neuro: Cranial nerves 2-12 grossly intact, sensation is normal throughout Psych: A&Ox3 with an appropriate affect.   Results for orders placed or performed during the  hospital encounter of 07/05/22 (from the past 48 hour(s))  CBC with Differential     Status: Abnormal   Collection Time: 07/05/22  7:55 AM  Result Value Ref Range   WBC 13.6 (H) 4.0 - 10.5 K/uL   RBC 4.58 4.22 - 5.81 MIL/uL   Hemoglobin 14.2 13.0 - 17.0 g/dL   HCT 28.3 15.1 - 76.1 %   MCV 88.9 80.0 - 100.0 fL   MCH 31.0 26.0 - 34.0 pg   MCHC 34.9 30.0 - 36.0 g/dL   RDW 60.7 (L) 37.1 - 06.2 %   Platelets 279 150 - 400 K/uL   nRBC 0.0 0.0 - 0.2 %   Neutrophils Relative % 88 %   Neutro Abs 11.9 (H) 1.7 - 7.7 K/uL   Lymphocytes Relative 7 %   Lymphs Abs 0.9 0.7 - 4.0 K/uL   Monocytes Relative 5 %   Monocytes Absolute 0.7 0.1 - 1.0 K/uL   Eosinophils Relative 0 %   Eosinophils Absolute 0.0 0.0 - 0.5 K/uL   Basophils Relative 0 %   Basophils Absolute 0.0 0.0 - 0.1 K/uL   Immature Granulocytes 0 %   Abs Immature Granulocytes 0.05 0.00 - 0.07 K/uL    Comment: Performed at [facility], [address] [place], [place] [address]  Basic metabolic panel     Status: Abnormal   Collection Time: 07/05/22  7:55 AM  Result Value Ref Range   Sodium 139 135 - 145 mmol/L  Potassium 3.3 (L) 3.5 - 5.1 mmol/L   Chloride 107 98 - 111 mmol/L   CO2 25 22 - 32 mmol/L   Glucose, Bld 106 (H) 70 - 99 mg/dL    Comment: Glucose reference range applies only to samples taken after fasting for at least 8 hours.   BUN 9 6 - 20 mg/dL   Creatinine, Ser 1.54 0.61 - 1.24 mg/dL   Calcium 9.0 8.9 - 00.8 mg/dL   GFR, Estimated >67 >61 mL/min    Comment: (NOTE) Calculated using the CKD-EPI Creatinine Equation (2021)    Anion gap 7 5 - 15    Comment: Performed at [facility], [address] [place], [place] [address]  Lipase, blood     Status: None   Collection Time: 07/05/22  7:55 AM  Result Value Ref Range   Lipase 35 11 - 51 U/L    Comment: Performed at [facility], [address] [place], [place] [address]  Urinalysis, Routine w reflex microscopic Urine, Clean Catch      Status: Abnormal   Collection Time: 07/05/22  8:14 AM  Result Value Ref Range   Color, Urine YELLOW YELLOW   APPearance CLEAR CLEAR   Specific Gravity, Urine 1.025 1.005 - 1.030   pH 7.0 5.0 - 8.0   Glucose, UA NEGATIVE NEGATIVE mg/dL   Hgb urine dipstick NEGATIVE NEGATIVE   Bilirubin Urine SMALL (A) NEGATIVE   Ketones, ur >=80 (A) NEGATIVE mg/dL   Protein, ur NEGATIVE NEGATIVE mg/dL   Nitrite NEGATIVE NEGATIVE   Leukocytes,Ua NEGATIVE NEGATIVE    Comment: Microscopic not done on urines with negative protein, blood, leukocytes, nitrite, or glucose < 500 mg/dL. Performed at [facility], [address] [place], [place] [address]    CT Abdomen Pelvis W Contrast  Result Date: 07/05/2022 CLINICAL DATA:  Right lower quadrant pain EXAM: CT ABDOMEN AND PELVIS WITH CONTRAST TECHNIQUE: Multidetector CT imaging of the abdomen and pelvis was performed using the standard protocol following bolus administration of intravenous contrast. RADIATION DOSE REDUCTION: This exam was performed according to the departmental dose-optimization program which includes automated exposure control, adjustment of the mA and/or kV according to patient size and/or use of iterative reconstruction technique. CONTRAST:  OMNIPAQUE IOHEXOL 300 MG/ML  SOLN COMPARISON:  None Available. FINDINGS: Lower chest: No acute abnormality. Hepatobiliary: No focal liver abnormality is seen. No gallstones, gallbladder wall thickening, or biliary dilatation. Pancreas: Unremarkable. No pancreatic ductal dilatation or surrounding inflammatory changes. Spleen: Normal in size without focal abnormality. Adrenals/Urinary Tract: Adrenals are unremarkable. Kidneys are unremarkable. Bladder is poorly distended but appears unremarkable. Stomach/Bowel: Stomach is within normal limits. Bowel is normal in caliber. Dilated appendix measuring up to 1.5 cm. Surrounding fat infiltration. Vascular/Lymphatic: No significant vascular abnormality.  Reactive right lower quadrant mesenteric nodes. Reproductive: Unremarkable. Other: Small volume free fluid in the pelvis related to above. No free air. Abdominal wall is unremarkable. Musculoskeletal: No acute osseous abnormality. IMPRESSION: Acute appendicitis without evidence of complication. Electronically Signed   By: Guadlupe Spanish M.D.   On: 07/05/2022 08:52      Assessment/Plan Acute appendicitis The patient has been seen, examined, labs, vitals, and imaging personally reviewed.  He has acute appendicitis.  We will plan to go to the OR for lap appy.  This has been discussed with the patient.  Also plans for likely discharge home from PACU if all goes well.  He is agreeable. I have discussed the procedure  and risks of appendectomy. The risks include but are not limited to bleeding, infection, wound problems, anesthesia, injury to intra-abdominal organs, possibility of postoperative ileus. He seems to understand and agrees with the plan.   FEN - NPO VTE - SCDs ID - Cipro/Flagyl Admit - outpatient, likely DC home from PACU  I reviewed ED provider notes, last 24 h vitals and pain scores, last 24 h labs and trends, and last 24 h imaging results.  Letha Cape, [facility] 07/05/2022, 10:43 AM Please see Amion for pager number during day hours 7:00am-4:30pm or 7:00am -11:30am on weekends

## 2022-07-05 NOTE — ED Notes (Signed)
[facility] PreOp RN informed of current ETA from our facility

## 2022-07-05 NOTE — Op Note (Signed)
Operative Report  Chris Hull 26 y.o. male  [id number]  [id number]  07/05/2022  Surgeon: Phylliss Blakes MD FACS   Procedure performed: Laparoscopic Appendectomy   Preop diagnosis: Acute appendicitis   Post-op diagnosis/intraop findings: Acute appendicitis   Specimens: appendix   EBL: minimal   Complications: none   Description of procedure: After obtaining informed consent the patient was brought to the operating room. Antibiotics were administered. SCD's were applied. General endotracheal anesthesia was initiated and a formal time-out was performed. Foley catheter was inserted which is removed at the end of the case. The abdomen was prepped and draped in the usual sterile fashion and the abdomen was entered using an infraumbilical Veress needle and insufflated to 15 mmHg. A 5 mm trocar and camera were then introduced, the abdomen was inspected and there is no evidence of injury from our entry. A suprapubic 5 mm trocar and a left lower quadrant 12 mm trocar were introduced under direct visualization following infiltration with local. The patient was then placed in Trendelenburg and rotated to the left and the small bowel was reflected cephalad. The appendix is found to be inflamed with a mild purulent exudate but no perforation or gangrene.  Purulent fluid of small volume in the pelvis was evacuated.  The mesentery was somewhat enlarged. The appendix was partially retrocecal. A combination of blunt dissection and harmonic scalpel were used to free it of its retroperitoneal attachments. Great care was taken to ensure no injury to surrounding retroperitoneal structures, cecum or terminal ileum. The mesoappendix was divided with the harmonic scalpel, isolating the base of the appendix. Hemostasis was ensured. A blue load 68mm endoGIA stapler was used to transect the appendix from the cecum, taking a cuff of viable cecum with the specimen. The appendix was placed in an Endo Catch bag and removed  through our 12 mm trocar site. The staple line was reinspected and confirmed to be intact and viable.  A small amount of bleeding along the medial edge of the staple line was controlled with clips.  The small bowel was run several feet from the ilececal valve proximally with no other abnormalities identified. The omentum was brought down over the staple line.  The 30mm trocar site in the left lower quadrant was closed with a 0 vicryl in the fascia under direct visualization using a PMI device. The abdomen was desufflated and all trocars removed. The skin incisions were closed with subcuticular 4-0 monocryl and Dermabond. The patient was awakened, extubated and transported to the recovery room in stable condition.    All counts were correct at the completion of the case.

## 2022-07-05 NOTE — ED Notes (Signed)
Phone Handoff Report provided to [facility] Pre-OP RN

## 2022-07-05 NOTE — ED Notes (Signed)
Phone Handoff Report given to [facility] Transport Team, spoke with David 

## 2022-07-05 NOTE — ED Provider Notes (Signed)
[facility] EMERGENCY DEPARTMENT Provider Note   CSN: [id number] Arrival date & time: 07/05/22  6659     History  Chief Complaint  Patient presents with   Abdominal Pain    Chris Hull is a 26 y.o. male with a history of asthma who presents with abdominal pain.  Pain began suddenly around 2 AM.  Its described as a burning pain.  Began right upper quadrant/epigastric area.  Has since radiated to lower abdomen.  Was initially 10 out of 10.  Has improved somewhat.  Felt a little bit better after a bowel movement.  Associated with some nausea and vomiting.  Vomit was described as partially digested food, followed by yellow liquid.  No blood in vomit.  Bowel movement was normal.  No diarrhea.  Patient was in his usual state of health yesterday.  Ate [employer] last night for dinner.  Denies fevers chills.  No chest pain or shortness of breath.  No rashes.  No dysuria or increased/decreased urinary frequency.  No personal history of similar episodes.  No personal history of abdominal surgeries.  Does report that mother had gallbladder taken out.   Social history: Lives in [place] with mother.  Currently raising a litter of puppies.  Drinks a couple drinks a week.  Vapes every day.  Uses marijuana occasionally.  Does not use opioids.   Abdominal Pain      Home Medications Prior to Admission medications   Medication Sig Start Date End Date Taking? Authorizing Provider  albuterol (PROVENTIL,VENTOLIN) 90 MCG/ACT inhaler Inhale 1-2 puffs into the lungs every 4 (four) hours as needed.      [provider]  budesonide-formoterol (SYMBICORT) 160-4.5 MCG/ACT inhaler Inhale 2 puffs into the lungs 2 (two) times daily.      [provider]  desloratadine (CLARINEX) 5 MG tablet Take 5 mg by mouth daily.      [provider]  diphenhydramine-acetaminophen (TYLENOL PM) 25-500 MG TABS Take 1 tablet by mouth at bedtime as needed.    [provider]   levalbuterol Pauline Aus) 0.63 MG/3ML nebulizer solution Take 1 ampule by nebulization every 4 (four) hours as needed.      [provider]  mometasone (NASONEX) 50 MCG/ACT nasal spray Place 2 sprays into the nose daily.      [provider]  mupirocin ointment (BACTROBAN) 2 % Apply topically 3 (three) times daily. 08/07/13   Reuben Likes, MD  sulfamethoxazole-trimethoprim (BACTRIM DS) 800-160 MG per tablet Take 2 tablets by mouth 2 (two) times daily. Patient not taking: Reported on 11/15/2018 08/07/13   Reuben Likes, MD      Allergies    Penicillins    Review of Systems   Review of Systems  Gastrointestinal:  Positive for abdominal pain.  All other systems reviewed and are negative.   Physical Exam Updated Vital Signs BP 125/62 (BP Location: Right Arm)   Pulse 69   Temp 98.6 F (37 C) (Oral)   Resp 16   Wt 133.4 kg   SpO2 98%   BMI 38.80 kg/m  Physical Exam Vitals and nursing note reviewed.  Constitutional:      General: He is not in acute distress.    Appearance: He is well-developed.  HENT:     Head: Normocephalic and atraumatic.  Eyes:     Conjunctiva/sclera: Conjunctivae normal.  Cardiovascular:     Rate and Rhythm: Normal rate and regular rhythm.     Heart sounds: No murmur heard. Pulmonary:  Effort: Pulmonary effort is normal. No respiratory distress.     Breath sounds: Normal breath sounds.  Abdominal:     Palpations: Abdomen is soft.     Tenderness: There is abdominal tenderness.     Comments: Tenderness diffusely, but most notably over right lower quadrant.  No rebound tenderness or guarding.  Musculoskeletal:        General: No swelling.     Cervical back: Neck supple.  Skin:    General: Skin is warm and dry.     Capillary Refill: Capillary refill takes less than 2 seconds.  Neurological:     Mental Status: He is alert.  Psychiatric:        Mood and Affect: Mood normal.     ED Results / Procedures / Treatments   Labs (all  labs ordered are listed, but only abnormal results are displayed) Labs Reviewed  CBC WITH DIFFERENTIAL/PLATELET - Abnormal; Notable for the following components:      Result Value   WBC 13.6 (*)    RDW 11.4 (*)    Neutro Abs 11.9 (*)    All other components within normal limits  BASIC METABOLIC PANEL - Abnormal; Notable for the following components:   Potassium 3.3 (*)    Glucose, Bld 106 (*)    All other components within normal limits  URINALYSIS, ROUTINE W REFLEX MICROSCOPIC - Abnormal; Notable for the following components:   Bilirubin Urine SMALL (*)    Ketones, ur >=80 (*)    All other components within normal limits  LIPASE, BLOOD  COMPREHENSIVE METABOLIC PANEL    EKG None  Radiology CT Abdomen Pelvis W Contrast  Result Date: 07/05/2022 CLINICAL DATA:  Right lower quadrant pain EXAM: CT ABDOMEN AND PELVIS WITH CONTRAST TECHNIQUE: Multidetector CT imaging of the abdomen and pelvis was performed using the standard protocol following bolus administration of intravenous contrast. RADIATION DOSE REDUCTION: This exam was performed according to the departmental dose-optimization program which includes automated exposure control, adjustment of the mA and/or kV according to patient size and/or use of iterative reconstruction technique. CONTRAST:  OMNIPAQUE IOHEXOL 300 MG/ML  SOLN COMPARISON:  None Available. FINDINGS: Lower chest: No acute abnormality. Hepatobiliary: No focal liver abnormality is seen. No gallstones, gallbladder wall thickening, or biliary dilatation. Pancreas: Unremarkable. No pancreatic ductal dilatation or surrounding inflammatory changes. Spleen: Normal in size without focal abnormality. Adrenals/Urinary Tract: Adrenals are unremarkable. Kidneys are unremarkable. Bladder is poorly distended but appears unremarkable. Stomach/Bowel: Stomach is within normal limits. Bowel is normal in caliber. Dilated appendix measuring up to 1.5 cm. Surrounding fat infiltration.  Vascular/Lymphatic: No significant vascular abnormality. Reactive right lower quadrant mesenteric nodes. Reproductive: Unremarkable. Other: Small volume free fluid in the pelvis related to above. No free air. Abdominal wall is unremarkable. Musculoskeletal: No acute osseous abnormality. IMPRESSION: Acute appendicitis without evidence of complication. Electronically Signed   By: Guadlupe Spanish M.D.   On: 07/05/2022 08:52    Medications Ordered in ED Medications  ciprofloxacin (CIPRO) IVPB 400 mg (has no administration in time range)  metroNIDAZOLE (FLAGYL) IVPB 500 mg (has no administration in time range)  fentaNYL (SUBLIMAZE) injection 50 mcg (50 mcg Intravenous Given 07/05/22 0826)  ondansetron (ZOFRAN) injection 4 mg (4 mg Intravenous Given 07/05/22 0824)  sodium chloride 0.9 % bolus 1,000 mL (1,000 mLs Intravenous New Bag/Given 07/05/22 0823)  iohexol (OMNIPAQUE) 300 MG/ML solution 100 mL (100 mLs Intravenous Contrast Given 07/05/22 2355)    ED Course/ Medical Decision Making/ A&P Clinical Course as of  07/05/22 0941  Mon Jul 05, 2022  0904 CT abdomen with appendicitis.  No perforation or abscess.  Will call general surgery. [MM]  [phone] Consulted with general surgery.  They will see the patient at [facility].  Patient updated.  He remains hemodynamically stable and comfortable.  We will start Cipro and Flagyl. [MM]    Clinical Course User Index [MM] Marrianne Mood, MD                           Medical Decision Making Amount and/or Complexity of Data Reviewed Labs: ordered. Radiology: ordered.  Risk Prescription drug management.   Chris Hull is a 26 year old male with a history of asthma who presents with several hours of right upper quadrant/epigastric abdominal pain that radiates to the right lower quadrant, associated with nausea and vomiting.  This involves an extensive number of treatment options, and is a complaint that carries with it a high risk of complications and morbidity.   The differential diagnosis includes appendicitis, cholelithiasis/cholecystitis, pancreatitis, peptic ulcer disease, GERD, infectious gastritis   Co morbidities that complicate the patient evaluation  Asthma   Social Determinants of Health:  Daily vape use Difficulty obtaining Insurance  Lab Tests:  I Ordered (or co-signed), and personally interpreted labs.  The pertinent results include:   CBC: leukocytosis   Imaging Studies ordered:  I ordered (or co-signed) imaging studies including CT abdomen/pelvis with contrast  I independently visualized and interpreted imaging which showed acute appendicitis without perforation or abscess. I agree with the radiologist interpretation   Medicines ordered and prescription drug management:  I ordered medication including fentanyl, ondansetron, NS bolus Reevaluation of the patient after these medicines showed that the patient improved   Consultations Obtained:  I requested consultation with general surgery,  and discussed lab and imaging findings as well as pertinent plan - they recommend: appendectomy.   Reevaluation:  After the interventions noted above, I reevaluated the patient and found that they have :improved.    Dispostion:  After consideration of the diagnostic results and the patients response to treatment, I feel that the patent would benefit from admission for appendectomy.          Final Clinical Impression(s) / ED Diagnoses Final diagnoses:  Other acute appendicitis  Acute appendicitis, unspecified acute appendicitis type  RLQ abdominal pain  Nausea and vomiting, unspecified vomiting type    Rx / DC Orders ED Discharge Orders     None         Marrianne Mood, MD 07/05/22 0941    Tegeler, Canary Brim, MD 07/05/22 1026

## 2022-07-05 NOTE — Transfer of Care (Signed)
Immediate Anesthesia Transfer of Care Note  Patient: Chris Hull  Procedure(s) Performed: APPENDECTOMY LAPAROSCOPIC (Abdomen)  Patient Location: PACU  Anesthesia Type:General  Level of Consciousness: drowsy  Airway & Oxygen Therapy: Patient Spontanous Breathing and Patient connected to face mask oxygen  Post-op Assessment: Report given to RN, Post -op Vital signs reviewed and stable and Patient moving all extremities X 4  Post vital signs: Reviewed and stable  Last Vitals:  Vitals Value Taken Time  BP 162/74 07/05/22 1536  Temp    Pulse 87 07/05/22 1537  Resp 24 07/05/22 1537  SpO2 95 % 07/05/22 1537  Vitals shown include unvalidated device data.  Last Pain:  Vitals:   07/05/22 1133  TempSrc: Oral  PainSc:          Complications: No notable events documented.

## 2022-07-05 NOTE — Anesthesia Postprocedure Evaluation (Signed)
Anesthesia Post Note  Patient: Jahvon Gosline  Procedure(s) Performed: APPENDECTOMY LAPAROSCOPIC (Abdomen)     Patient location during evaluation: PACU Anesthesia Type: General Level of consciousness: awake and alert Pain management: pain level controlled Vital Signs Assessment: post-procedure vital signs reviewed and stable Respiratory status: spontaneous breathing, nonlabored ventilation, respiratory function stable and patient connected to nasal cannula oxygen Cardiovascular status: blood pressure returned to baseline and stable Postop Assessment: no apparent nausea or vomiting Anesthetic complications: no   No notable events documented.  Last Vitals:  Vitals:   07/05/22 1600 07/05/22 1615  BP: (!) 162/98 (!) 156/94  Pulse: 81 79  Resp: 20 17  Temp:    SpO2: 92% 91%    Last Pain:  Vitals:   07/05/22 1615  TempSrc:   PainSc: Asleep                 Trevor Iha

## 2022-07-05 NOTE — ED Notes (Signed)
Patient transported to CT 

## 2022-07-06 ENCOUNTER — Encounter (HOSPITAL_COMMUNITY): Payer: Self-pay | Admitting: Surgery

## 2022-07-06 LAB — SURGICAL PATHOLOGY
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
## 2024-05-15 ENCOUNTER — Observation Stay (HOSPITAL_COMMUNITY)

## 2024-05-15 ENCOUNTER — Emergency Department (HOSPITAL_COMMUNITY)

## 2024-05-15 ENCOUNTER — Observation Stay (HOSPITAL_COMMUNITY)
Admission: EM | Admit: 2024-05-15 | Discharge: 2024-05-16 | Disposition: A | Discharge status: Home / Self Care | Attending: Internal Medicine | Admitting: Internal Medicine

## 2024-05-15 DIAGNOSIS — J45909 Unspecified asthma, uncomplicated: Secondary | ICD-10-CM | POA: Insufficient documentation

## 2024-05-15 DIAGNOSIS — J0100 Acute maxillary sinusitis, unspecified: Secondary | ICD-10-CM | POA: Insufficient documentation

## 2024-05-15 DIAGNOSIS — D72829 Elevated white blood cell count, unspecified: Secondary | ICD-10-CM | POA: Diagnosis not present

## 2024-05-15 DIAGNOSIS — G9340 Encephalopathy, unspecified: Secondary | ICD-10-CM | POA: Diagnosis not present

## 2024-05-15 DIAGNOSIS — R17 Unspecified jaundice: Secondary | ICD-10-CM | POA: Diagnosis not present

## 2024-05-15 DIAGNOSIS — R4182 Altered mental status, unspecified: Secondary | ICD-10-CM | POA: Diagnosis present

## 2024-05-15 DIAGNOSIS — Z79899 Other long term (current) drug therapy: Secondary | ICD-10-CM | POA: Diagnosis not present

## 2024-05-15 DIAGNOSIS — E876 Hypokalemia: Secondary | ICD-10-CM | POA: Insufficient documentation

## 2024-05-15 LAB — BLOOD GAS, VENOUS
Acid-base deficit: 0.5 mmol/L (ref 0.0–2.0)
Bicarbonate: 24.2 mmol/L (ref 20.0–28.0)
O2 Saturation: 99.1 %
Patient temperature: 36.6
pCO2, Ven: 38 mmHg — ABNORMAL LOW (ref 44–60)
pH, Ven: 7.41 (ref 7.25–7.43)
pO2, Ven: 97 mmHg — ABNORMAL HIGH (ref 32–45)

## 2024-05-15 LAB — URINALYSIS, W/ REFLEX TO CULTURE (INFECTION SUSPECTED)
Bilirubin Urine: NEGATIVE
Glucose, UA: NEGATIVE mg/dL
Hgb urine dipstick: NEGATIVE
Ketones, ur: 20 mg/dL — AB
Leukocytes,Ua: NEGATIVE
Nitrite: NEGATIVE
Protein, ur: NEGATIVE mg/dL
Specific Gravity, Urine: 1.015 (ref 1.005–1.030)
pH: 7 (ref 5.0–8.0)

## 2024-05-15 LAB — TSH: TSH: 0.351 u[IU]/mL (ref 0.350–4.500)

## 2024-05-15 LAB — BASIC METABOLIC PANEL WITH GFR
Anion gap: 11 (ref 5–15)
Anion gap: 10 (ref 5–15)
BUN: 7 mg/dL (ref 6–20)
BUN: 7 mg/dL (ref 6–20)
CO2: 23 mmol/L (ref 22–32)
CO2: 23 mmol/L (ref 22–32)
Calcium: 9.1 mg/dL (ref 8.9–10.3)
Calcium: 9 mg/dL (ref 8.9–10.3)
Chloride: 102 mmol/L (ref 98–111)
Chloride: 104 mmol/L (ref 98–111)
Creatinine, Ser: 1 mg/dL (ref 0.61–1.24)
Creatinine, Ser: 0.86 mg/dL (ref 0.61–1.24)
GFR, Estimated: >60 mL/min (ref >60)
GFR, Estimated: >60 mL/min (ref >60)
Glucose, Bld: 112 mg/dL — ABNORMAL HIGH (ref 70–99)
Glucose, Bld: 112 mg/dL — ABNORMAL HIGH (ref 70–99)
Potassium: 3.5 mmol/L (ref 3.5–5.1)
Potassium: 3.6 mmol/L (ref 3.5–5.1)
Sodium: 136 mmol/L (ref 135–145)
Sodium: 137 mmol/L (ref 135–145)

## 2024-05-15 LAB — I-STAT CG4 LACTIC ACID, ED
Lactic Acid, Venous: 2.5 mmol/L (ref 0.5–1.9)
Lactic Acid, Venous: 2.7 mmol/L (ref 0.5–1.9)

## 2024-05-15 LAB — COMPREHENSIVE METABOLIC PANEL WITH GFR
ALT: 18 U/L (ref 0–44)
AST: 19 U/L (ref 15–41)
Albumin: 3.9 g/dL (ref 3.5–5.0)
Alkaline Phosphatase: 62 U/L (ref 38–126)
Anion gap: 13 (ref 5–15)
BUN: 7 mg/dL (ref 6–20)
CO2: 19 mmol/L — ABNORMAL LOW (ref 22–32)
Calcium: 9.4 mg/dL (ref 8.9–10.3)
Chloride: 104 mmol/L (ref 98–111)
Creatinine, Ser: 1.07 mg/dL (ref 0.61–1.24)
GFR, Estimated: >60 mL/min (ref >60)
Glucose, Bld: 115 mg/dL — ABNORMAL HIGH (ref 70–99)
Potassium: 3.2 mmol/L — ABNORMAL LOW (ref 3.5–5.1)
Sodium: 136 mmol/L (ref 135–145)
Total Bilirubin: 2.7 mg/dL — ABNORMAL HIGH (ref 0.0–1.2)
Total Protein: 6.9 g/dL (ref 6.5–8.1)

## 2024-05-15 LAB — CBC WITH DIFFERENTIAL/PLATELET
Abs Immature Granulocytes: 0.04 K/uL (ref 0.00–0.07)
Basophils Absolute: 0 K/uL (ref 0.0–0.1)
Basophils Relative: 0 %
Eosinophils Absolute: 0 K/uL (ref 0.0–0.5)
Eosinophils Relative: 0 %
HCT: 43.1 % (ref 39.0–52.0)
Hemoglobin: 14.8 g/dL (ref 13.0–17.0)
Immature Granulocytes: 0 %
Lymphocytes Relative: 11 %
Lymphs Abs: 1.3 K/uL (ref 0.7–4.0)
MCH: 30.5 pg (ref 26.0–34.0)
MCHC: 34.3 g/dL (ref 30.0–36.0)
MCV: 88.9 fL (ref 80.0–100.0)
Monocytes Absolute: 0.7 K/uL (ref 0.1–1.0)
Monocytes Relative: 6 %
Neutro Abs: 9.8 K/uL — ABNORMAL HIGH (ref 1.7–7.7)
Neutrophils Relative %: 83 %
Platelets: 294 K/uL (ref 150–400)
RBC: 4.85 MIL/uL (ref 4.22–5.81)
RDW: 11.1 % — ABNORMAL LOW (ref 11.5–15.5)
WBC: 11.8 K/uL — ABNORMAL HIGH (ref 4.0–10.5)
nRBC: 0 % (ref 0.0–0.2)

## 2024-05-15 LAB — LIPASE, BLOOD: Lipase: 32 U/L (ref 11–51)

## 2024-05-15 LAB — RAPID URINE DRUG SCREEN, HOSP PERFORMED
Amphetamines: NOT DETECTED
Barbiturates: NOT DETECTED
Benzodiazepines: POSITIVE — AB
Cocaine: NOT DETECTED
Opiates: NOT DETECTED
Tetrahydrocannabinol: POSITIVE — AB

## 2024-05-15 LAB — CK: Total CK: 254 U/L (ref 49–397)

## 2024-05-15 LAB — HIV ANTIBODY (ROUTINE TESTING W REFLEX): HIV Screen 4th Generation wRfx: NONREACTIVE

## 2024-05-15 LAB — LACTIC ACID, PLASMA
Lactic Acid, Venous: 2.3 mmol/L (ref 0.5–1.9)
Lactic Acid, Venous: 1.2 mmol/L (ref 0.5–1.9)

## 2024-05-15 LAB — AMMONIA: Ammonia: 30 umol/L (ref 9–35)

## 2024-05-15 LAB — CBG MONITORING, ED: Glucose-Capillary: 107 mg/dL — ABNORMAL HIGH (ref 70–99)

## 2024-05-15 LAB — SALICYLATE LEVEL: Salicylate Lvl: <7 mg/dL — ABNORMAL LOW (ref 7.0–30.0)

## 2024-05-15 LAB — CBC
HCT: 43.4 % (ref 39.0–52.0)
Hemoglobin: 15.1 g/dL (ref 13.0–17.0)
MCH: 31.1 pg (ref 26.0–34.0)
MCHC: 34.8 g/dL (ref 30.0–36.0)
MCV: 89.3 fL (ref 80.0–100.0)
Platelets: 300 10*3/uL (ref 150–400)
RBC: 4.86 MIL/uL (ref 4.22–5.81)
RDW: 11.1 % — ABNORMAL LOW (ref 11.5–15.5)
WBC: 13 10*3/uL — ABNORMAL HIGH (ref 4.0–10.5)
nRBC: 0 % (ref 0.0–0.2)

## 2024-05-15 LAB — PROTIME-INR
INR: 1.2 (ref 0.8–1.2)
Prothrombin Time: 15.2 s (ref 11.4–15.2)

## 2024-05-15 LAB — ACETAMINOPHEN LEVEL
Acetaminophen (Tylenol), Serum: <10 ug/mL — ABNORMAL LOW (ref 10–30)
Acetaminophen (Tylenol), Serum: <10 ug/mL — ABNORMAL LOW (ref 10–30)

## 2024-05-15 LAB — ETHANOL: Alcohol, Ethyl (B): <15 mg/dL

## 2024-05-15 LAB — MAGNESIUM: Magnesium: 1.5 mg/dL — ABNORMAL LOW (ref 1.7–2.4)

## 2024-05-15 LAB — TROPONIN I (HIGH SENSITIVITY): Troponin I (High Sensitivity): 4 ng/L (ref <18)

## 2024-05-15 MED ORDER — LORAZEPAM 2 MG/ML IJ SOLN: 2.0000 mg | Freq: Once | INTRAMUSCULAR | Status: DC

## 2024-05-15 MED ORDER — HALOPERIDOL LACTATE 5 MG/ML IJ SOLN
5.0000 mg | Freq: Once | INTRAMUSCULAR | Status: AC
  Administered 2024-05-15: 5 mg via INTRAMUSCULAR
  Filled 2024-05-15: qty 1

## 2024-05-15 MED ORDER — MAGNESIUM SULFATE 2 GM/50ML IV SOLN
2.0000 g | Freq: Once | INTRAVENOUS | Status: DC
  Filled 2024-05-15: qty 50

## 2024-05-15 MED ORDER — MAGNESIUM SULFATE 2 GM/50ML IV SOLN
2.0000 g | Freq: Once | INTRAVENOUS | Status: AC
  Administered 2024-05-15: 2 g via INTRAVENOUS
  Filled 2024-05-15: qty 50

## 2024-05-15 MED ORDER — POTASSIUM CHLORIDE 10 MEQ/100ML IV SOLN: 10.0000 meq | INTRAVENOUS | Status: DC

## 2024-05-15 MED ORDER — DIPHENHYDRAMINE HCL 50 MG/ML IJ SOLN
50.0000 mg | Freq: Once | INTRAMUSCULAR | Status: AC
Start: 2024-05-15 — End: 2024-05-15
  Administered 2024-05-15: 50 mg via INTRAMUSCULAR
  Filled 2024-05-15: qty 1

## 2024-05-15 MED ORDER — POTASSIUM CHLORIDE CRYS ER 20 MEQ PO TBCR
40.0000 meq | EXTENDED_RELEASE_TABLET | ORAL | Status: AC
  Filled 2024-05-15: qty 2

## 2024-05-15 MED ORDER — LACTATED RINGERS IV BOLUS (SEPSIS)
1000.0000 mL | Freq: Once | INTRAVENOUS | Status: AC
  Administered 2024-05-15: 1000 mL via INTRAVENOUS

## 2024-05-15 MED ORDER — SODIUM CHLORIDE 0.9 % IV SOLN
1.0000 g | Freq: Once | INTRAVENOUS | Status: AC
  Administered 2024-05-15: 1 g via INTRAVENOUS
  Filled 2024-05-15: qty 10

## 2024-05-15 MED ORDER — SODIUM CHLORIDE 0.9 % IV SOLN
500.0000 mg | Freq: Every day | INTRAVENOUS | Status: DC
  Administered 2024-05-16: 500 mg via INTRAVENOUS
  Filled 2024-05-15: qty 5

## 2024-05-15 MED ORDER — ENOXAPARIN SODIUM 40 MG/0.4ML IJ SOSY
40.0000 mg | PREFILLED_SYRINGE | INTRAMUSCULAR | Status: DC
  Administered 2024-05-15: 40 mg via SUBCUTANEOUS
  Filled 2024-05-15: qty 0.4

## 2024-05-15 MED ORDER — SODIUM CHLORIDE 0.9 % IV SOLN
500.0000 mg | Freq: Once | INTRAVENOUS | Status: AC
  Administered 2024-05-15: 500 mg via INTRAVENOUS
  Filled 2024-05-15: qty 5

## 2024-05-15 MED ORDER — LORAZEPAM 2 MG/ML IJ SOLN
2.0000 mg | Freq: Once | INTRAMUSCULAR | Status: AC
  Administered 2024-05-15: 2 mg via INTRAVENOUS
  Filled 2024-05-15: qty 1

## 2024-05-15 MED ORDER — SODIUM CHLORIDE 0.9 % IV SOLN
1.0000 g | Freq: Every day | INTRAVENOUS | Status: DC
  Administered 2024-05-16: 1 g via INTRAVENOUS
  Filled 2024-05-15: qty 10

## 2024-05-15 NOTE — H&P (Cosign Needed Addendum)
 Date: 05/15/2024               Patient Name:  Chris Hull MRN: [id number]  DOB: 11-Dec-1995 Age / Sex: 28 y.o., male   PCP: Patient, No Pcp Per         Medical Service: Internal Medicine Teaching Service         Attending Physician: Dr. Broadus Canes, Whitney Hams, MD      First Contact 24/7: Dr. Diann Forth Pager:  [phone]  Second Contact 24/7: Dr. Garald Jumbo Pager:  [phone]   SUBJECTIVE   Chief Complaint: Altered Mental Status  History of Present Illness: Chris Hull is a 28 y.o. male with PMH of Asthma, Migraines who presented to [facility] ED for evaluation of altered mental status. He is admitted on 6/17 for acute encephalopathy with unclear cause.  The patient is unable to provide a history but his mother and aunt are at bedside. They share that he was last contacted yesterday evening by phone where he seemed normal but, unusually, reported tiredness and a plan to go to sleep at 7 pm. He was last seen by them Sunday evening at a family dinner, where he seemed normal but somewhat reserved. The patient lives by himself. 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 Thereafter he was discovered at a [employer] where he was confused, disoriented, wandering. EMS arrived and removed him from his car where he was lethargic. An empty box of ibuprofen  was found, but later an unopened bottle was discovered on his person. He awakens at times to answer questions and states he has not consumed anything unusual and does not share any complaints - expressing he would like to go home. He states he took two ibuprofen  and last smoked weed yesterday. Prior to today his mother reports he had no acute recent complaints or changes, but has had chronic abdominal pain since a lap appendectomy two years ago.  His mother reports recent emotional distress in the patient due to the loss of his father, grandfather, and a friend over the last few years alongside her own diagnosis  with advanced cancer. She reports he consumes marijuana but is unaware of any other substances.  He was initially hypotensive that resolved with 1L fluids by EMS. Narcan was not provided. Thereafter he was hemodynamically stable and not requiring oxygen . ER workup included negative CT head, other than maxillary sinus disease, CXR with possible RLL consolidation, elevated lactic 2.4. He was started on ceftriaxone and zithromax  for possible CAP.  Blood cultures were collected.  At time of admission, Mr Toya wakes up intermittently and attempts to leave bed. He falls asleep again quickly and does not awake to sternal rub. He recently required sedation with ativan, haldol, benadryl.  Past Medical History Past Medical History:  Diagnosis Date   Asthma    Migraine     Meds:  Unable to confirm due to encephalopathy Albuterol as needed Symbicort 2 puggs BID Desloratadine 5mg  daily Tylenol  prn Levalbuterol neb q4h as needed Mometasone 2 sprays daily Tramadol  50 q6h prn Current Meds  Medication Sig   albuterol (PROVENTIL,VENTOLIN) 90 MCG/ACT inhaler Inhale 1-2 puffs into the lungs every 4 (four) hours as needed for shortness of breath.   budesonide-formoterol (SYMBICORT) 160-4.5 MCG/ACT inhaler Inhale 2 puffs into the lungs daily as needed (For shortness of breath).   ibuprofen  (ADVIL ) 200 MG tablet Take 200 mg by mouth every 6 (six) hours as needed for moderate pain (pain score 4-6).  loratadine (CLARITIN) 10 MG tablet Take 10 mg by mouth daily as needed for allergies.   Past Surgical History Past Surgical History:  Procedure Laterality Date   LAPAROSCOPIC APPENDECTOMY N/A 07/05/2022   Procedure: APPENDECTOMY LAPAROSCOPIC;  Surgeon: Adalberto Acton, MD;  Location: [facility] ORS;  Service: General;  Laterality: N/A;    Social:  Lives alone in town Occupation: Paediatric nurse Support: Extensive from local family Level of Function: independent in ADLs/IADLs PCP: No PCP Substances: known marijuana  user, no reported alcohol, drugs, from [place]  Family History:  Family history of fatty liver disease and gallbladder cancer in mother.  Allergies: Allergies as of 05/15/2024 - Review Complete 05/15/2024  Allergen Reaction Noted   Penicillins Hives 11/19/2011   Review of Systems: A complete ROS was negative except as per HPI.   OBJECTIVE:   Physical Exam: Blood pressure 128/67, pulse (!) 52, temperature 97.7 F (36.5 C), temperature source Oral, resp. rate 16, SpO2 100%.  Constitutional: somnolent, but awakens intermittently and attempts to leave bed. In no acute distress. HENT: Normocephalic, atraumatic. Missing R upper molar, appears old without apparent abscess. Nose and mouth clear.  Eyes: Sclera non-icteric, PERRL, EOM intact. Pupils are reactive and not pinpoint. Cardio: Regular rate and rhythm. No murmurs, rubs, or gallops. 2+ bilateral radial and dorsalis pedis  pulses. Pulm:Clear to auscultation bilaterally, but not cooperative to move air. Normal work of breathing on room air. Abdomen: Soft, non-tender, non-distended, positive bowel sounds. NWG:NFAOZHYQ for extremity edema. There are no lacerations/injuries on the extremities. Skin:Warm and dry. Extremities are cool, pulses intact. Neuro:Alert and oriented to self only. Unable to assess for focal deficits. Psych: somnolent, agitated when awake but not violent  Labs: CBC    Component Value Date/Time   WBC 11.8 (H) 05/15/2024 1020   RBC 4.85 05/15/2024 1020   HGB 14.8 05/15/2024 1020   HCT 43.1 05/15/2024 1020   PLT 294 05/15/2024 1020   MCV 88.9 05/15/2024 1020   MCH 30.5 05/15/2024 1020   MCHC 34.3 05/15/2024 1020   RDW 11.1 (L) 05/15/2024 1020   LYMPHSABS 1.3 05/15/2024 1020   MONOABS 0.7 05/15/2024 1020   EOSABS 0.0 05/15/2024 1020   BASOSABS 0.0 05/15/2024 1020     CMP     Component Value Date/Time   NA 136 05/15/2024 1020   K 3.2 (L) 05/15/2024 1020   CL 104 05/15/2024 1020   CO2 19 (L) 05/15/2024  1020   GLUCOSE 115 (H) 05/15/2024 1020   BUN 7 05/15/2024 1020   CREATININE 1.07 05/15/2024 1020   CALCIUM 9.4 05/15/2024 1020   PROT 6.9 05/15/2024 1020   ALBUMIN 3.9 05/15/2024 1020   AST 19 05/15/2024 1020   ALT 18 05/15/2024 1020   ALKPHOS 62 05/15/2024 1020   BILITOT 2.7 (H) 05/15/2024 1020   GFRNONAA >60 05/15/2024 1020   GFRAA NOT CALCULATED 11/20/2011 0106    Imaging: DG Chest Port 1 View Result Date: 05/15/2024 CLINICAL DATA:  Questionable sepsis - evaluate for abnormality EXAM: PORTABLE CHEST 1 VIEW COMPARISON:  PA and lateral radiographs of the chest dated November 20, 2011. FINDINGS: The heart is normal in size and the pulmonary vasculature is unremarkable. There is a mild hazy appearance of the right lateral lung base. Lungs are clear otherwise. The bones are unremarkable. IMPRESSION: 1. Questionable mild hazy opacification/consolidation of the right lateral lung base. Electronically Signed   By: Maribeth Shivers M.D.   On: 05/15/2024 12:23   CT Head Wo Contrast Result Date: 05/15/2024 CLINICAL  DATA:  Mental status change, unknown cause EXAM: CT HEAD WITHOUT CONTRAST TECHNIQUE: Contiguous axial images were obtained from the base of the skull through the vertex without intravenous contrast. RADIATION DOSE REDUCTION: This exam was performed according to the departmental dose-optimization program which includes automated exposure control, adjustment of the mA and/or kV according to patient size and/or use of iterative reconstruction technique. COMPARISON:  CT of the head dated June 12, 2013. FINDINGS: Brain: Normal brain. No evidence of hemorrhage, mass, cortical infarct or hydrocephalus. Vascular: Unremarkable. Skull: Intact and unremarkable. Sinuses/Orbits: Minimal mucosal disease within the maxillary sinuses. The orbits appear normal. Other: None. IMPRESSION: 1. Normal brain. 2. Maxillary sinus disease. Electronically Signed   By: Maribeth Shivers M.D.   On: 05/15/2024 12:21     EKG: personally reviewed my interpretation is sinus rhythm with RBB and irregular rhythm changed from prior EKG today which was NSR.  ASSESSMENT & PLAN:   Assessment & Plan by Problem: Principal Problem:   Encephalopathy  Rodolph Hagemann is a 28 y.o. male with pertinent PMH of Asthma, Migraines who presented to [facility] ED for evaluation of altered mental status. He is admitted on 6/17 for acute encephalopathy with unclear cause.  Acute Encephalopathy unclear cause Combativeness, Confusion, Hypersomnolence Concern for toxic overdose This is a patient who cannot provide history. At admission, etiology of encephalopathy is not defined. At present he is hemodynamically stable and in no acute distress. He is lethargic but required heavy sedatives in ER due to agitation. When discovered, he was lethargic.  Given recent emotional distress, marijuana consumption of unknown origin, and the discover of an empty ibuprofen  box, I believe toxic encephalopathy is most likely (although it appears an unopened bottle of ibuprofen  is since discovered), but cause could also be metabolic vs infectious. With concern of ibuprofen  overdose, poison control recs per EDG are serial EKGs and BMPs q4h. Acetaminophen , salicylate, ammonia, ethanol negative. CT head no masses. He likely has a sinus infection that could cause sepsis, albeit he is afebrile. Broad workup per below and along his secondary problems.  - Consider infection: HIV, RPR, UA, blood culture. CXR with consolidation though I doubt CAP, CT with maxillary sinusisis - potential septic source? Tx CTX and Azithromycin . - Consider metabolics: TSH, VBG - Consider toxic: UDS and UA, bladder scan. Need catheter to collect. Unsure if anticholinergic effect - trend EKGs and BMP per poison control - If agitated but not violent, attempt to redirect and provide a sitter  EKG changes Hypomagnesemia Hypokalemia Initial EKG was NSR but a follow-up changed to irregular sinus  rhythm with evidence of an incomplete RBB, a change from earlier today. K 3.2 and Mg 1.6 at admission  - Replete electrolytes - Check troponins - ECHO - Trend EKGs q4h  Leukocytosis Maxillary Sinus Disease on CT CXR with consolidation In the ER the patient expressed a concern of facial pain that would correlate with evidence of a sinus infection on CT, and additionally he consumed ibuprofen  before admission. There is also a consolidation on lung XR that could represent CAP, but clinically does not appear to have pneumonia. I cannot obtain history to support ongoing illness but with his encephalopathy must consider sepsis. - Blood cultures collected and pending - CTX and Azithromycin  per EDP  Hyperlactemia Hypotension prior to arrival now resolved Was hypotensive upon discover. He required fluids by EMS but hemodynamically stable since. His extremities are now cool but great pulses. - Trend lactic acid, give fluids if persistent - Cardiac workup above -  Antibiotic coverage per above  Hyperbilirubinemia Chronic abdominal pain since Appendectomy in 2023 Family history gallbladder cancer in mother - Follow bilirubin, unclear if acute - KUB - RUQ US  for biliary disease - Lipase  Hx of emotional distress Pt has lost many family members in recent years. He has two emotional support animals. His mother reports self-treatment with marijuana. Must consider if this was a suicide attempt, although I think it unlikely.  Diet: NPO VTE: Enoxaparin IVF: None,None Code: Full  Dispo: Admit patient to Observation with expected length of stay less than 2 midnights.  Signed: Carleen Chary, DO Internal Medicine Resident PGY-1  05/15/2024, 3:32 PM

## 2024-05-15 NOTE — ED Triage Notes (Signed)
 Patient arrives via [facility] for sepsis/AMS. Patient is alert to self only, endorses pain only on left side of face, known abscess and has been using ibuprofen  OTC. Patient usually alert and oriented, unknown AMS. [facility] due to AMS.  Progressively worse left side of face pain x6 months, denies dental eval. Patient diaphoretic at bridge  EMS vitals 86/40 Sinus arrhythmia as low as 40, was NSR 70 Capno 18 RR 36 99 O2 room air  600 LR 18 LAC CBG 115 Temp 97.5

## 2024-05-15 NOTE — ED Notes (Signed)
 Per PA hold off on urine collection as patient is unable to urinate.

## 2024-05-15 NOTE — Progress Notes (Signed)
 Arrived to unit 1830. Trying to obtain UA and he immediately falls asleep  Not arousing at this time. Lab collecting blood work.

## 2024-05-15 NOTE — ED Notes (Signed)
 PA at bedside.

## 2024-05-15 NOTE — ED Notes (Signed)
 Patient becoming increasingly agitated and restless. PA at bedside.

## 2024-05-15 NOTE — Progress Notes (Signed)
 Provided prayer, emotional and spiritual support.  Family at bedside. Chaplain available as needed.     Anton Baton, [place], [facility], Pager [phone] .

## 2024-05-15 NOTE — ED Notes (Signed)
 Patient agitated and restless. PA notified and at bedside

## 2024-05-15 NOTE — ED Provider Notes (Addendum)
 [facility] Provider Note   CSN: [id number] Arrival date & time: 05/15/24  9041     Patient presents with: Altered Mental Status  HPI Chris Hull is a 28 y.o. male with history of migraines and asthma presenting altered mental status.  Patient was brought in by EMS from [facility].  The staff at [facility] was concerned due to his altered behavior.  On arrival he was notably confused unsure where he was.  Was able to recognize his mother and other family member.  Was unable to tell me if he was hurting anywhere.  He is accompanied by his mother who states that he has had several recent family members die and they are concerned that he has been using something but unsure what that may be.  They do state he regularly uses alcohol and marijuana.  Mother also stated that she discovered an empty box of ibuprofen  as well.  He was able to deny chest pain and abdominal pain.    Altered Mental Status      Prior to Admission medications   Medication Sig Start Date End Date Taking? Authorizing Provider  albuterol  (PROVENTIL ,VENTOLIN ) 90 MCG/ACT inhaler Inhale 1-2 puffs into the lungs every 4 (four) hours as needed.      [provider]  budesonide -formoterol  (SYMBICORT ) 160-4.5 MCG/ACT inhaler Inhale 2 puffs into the lungs 2 (two) times daily.      [provider]  desloratadine (CLARINEX) 5 MG tablet Take 5 mg by mouth daily.      [provider]  diphenhydramine -acetaminophen  (TYLENOL  PM) 25-500 MG TABS Take 1 tablet by mouth at bedtime as needed.    [provider]  ibuprofen  (ADVIL ) 800 MG tablet Take 1 tablet (800 mg total) by mouth every 8 (eight) hours as needed. Take with food 07/05/22   Vicci Burnard SAUNDERS, PA-C  levalbuterol (XOPENEX) 0.63 MG/3ML nebulizer solution Take 1 ampule by nebulization every 4 (four) hours as needed.      [provider]  mometasone (NASONEX) 50 MCG/ACT nasal spray Place 2  sprays into the nose daily.      [provider]  mupirocin  ointment (BACTROBAN ) 2 % Apply topically 3 (three) times daily. 08/07/13   Sonda Alm BROCKS, MD  traMADol  (ULTRAM ) 50 MG tablet Take 1 tablet (50 mg total) by mouth every 6 (six) hours as needed. 07/05/22   Vicci Burnard SAUNDERS, PA-C    Allergies: Penicillins    Review of Systems See HPI  Updated Vital Signs BP 128/67   Pulse (!) 52   Temp 97.7 F (36.5 C) (Oral)   Resp 16   SpO2 100%     Physical Exam   Vitals:   05/15/24 1040 05/15/24 1115  BP:  128/67  Pulse:  (!) 52  Resp:  16  Temp: 97.7 F (36.5 C)   SpO2:  100%    CONSTITUTIONAL:  well-appearing, NAD, confused, diaphoretic NEURO:  Alert and oriented x 2, CN 3-12 grossly intact, responsive to painful stimuli, opening and closing his eyes spontaneously, moving his extremities ENT/NECK:  Supple, no stridor  CARDIO:  regular rate and rhythm, appears well-perfused  PULM:  No respiratory distress, CTAB GI/GU:  non-distended, soft, non tender MSK/SPINE:  No gross deformities, no edema, moves all extremities  SKIN:  no rash, atraumatic  *Additional and/or pertinent findings included in MDM below  (all labs ordered are listed, but only abnormal results are displayed) Labs Reviewed  COMPREHENSIVE METABOLIC PANEL WITH GFR -  Abnormal; Notable for the following components:      Result Value   Potassium 3.2 (*)    CO2 19 (*)    Glucose, Bld 115 (*)    Total Bilirubin 2.7 (*)    All other components within normal limits  CBC WITH DIFFERENTIAL/PLATELET - Abnormal; Notable for the following components:   WBC 11.8 (*)    RDW 11.1 (*)    Neutro Abs 9.8 (*)    All other components within normal limits  ACETAMINOPHEN  LEVEL - Abnormal; Notable for the following components:   Acetaminophen  (Tylenol ), Serum <10 (*)    All other components within normal limits  SALICYLATE LEVEL - Abnormal; Notable for the following components:   Salicylate Lvl <7.0 (*)    All other  components within normal limits  MAGNESIUM  - Abnormal; Notable for the following components:   Magnesium  1.5 (*)    All other components within normal limits  CBG MONITORING, ED - Abnormal; Notable for the following components:   Glucose-Capillary 107 (*)    All other components within normal limits  I-STAT CG4 LACTIC ACID, ED - Abnormal; Notable for the following components:   Lactic Acid, Venous 2.5 (*)    All other components within normal limits  I-STAT CG4 LACTIC ACID, ED - Abnormal; Notable for the following components:   Lactic Acid, Venous 2.7 (*)    All other components within normal limits  CULTURE, BLOOD (ROUTINE X 2)  CULTURE, BLOOD (ROUTINE X 2)  PROTIME-INR  ETHANOL  CK  AMMONIA  URINALYSIS, W/ REFLEX TO CULTURE (INFECTION SUSPECTED)  RAPID URINE DRUG SCREEN, HOSP PERFORMED    EKG: None  Radiology: DG Chest Port 1 View Result Date: 05/15/2024 CLINICAL DATA:  Questionable sepsis - evaluate for abnormality EXAM: PORTABLE CHEST 1 VIEW COMPARISON:  PA and lateral radiographs of the chest dated November 20, 2011. FINDINGS: The heart is normal in size and the pulmonary vasculature is unremarkable. There is a mild hazy appearance of the right lateral lung base. Lungs are clear otherwise. The bones are unremarkable. IMPRESSION: 1. Questionable mild hazy opacification/consolidation of the right lateral lung base. Electronically Signed   By: Evalene Coho M.D.   On: 05/15/2024 12:23   CT Head Wo Contrast Result Date: 05/15/2024 CLINICAL DATA:  Mental status change, unknown cause EXAM: CT HEAD WITHOUT CONTRAST TECHNIQUE: Contiguous axial images were obtained from the base of the skull through the vertex without intravenous contrast. RADIATION DOSE REDUCTION: This exam was performed according to the departmental dose-optimization program which includes automated exposure control, adjustment of the mA and/or kV according to patient size and/or use of iterative reconstruction  technique. COMPARISON:  CT of the head dated June 12, 2013. FINDINGS: Brain: Normal brain. No evidence of hemorrhage, mass, cortical infarct or hydrocephalus. Vascular: Unremarkable. Skull: Intact and unremarkable. Sinuses/Orbits: Minimal mucosal disease within the maxillary sinuses. The orbits appear normal. Other: None. IMPRESSION: 1. Normal brain. 2. Maxillary sinus disease. Electronically Signed   By: Evalene Coho M.D.   On: 05/15/2024 12:21     .Critical Care  Performed by: Lang Norleen POUR, PA-C Authorized by: Lang Norleen POUR, PA-C   Critical care provider statement:    Critical care time (minutes):  30   Critical care was necessary to treat or prevent imminent or life-threatening deterioration of the following conditions:  Sepsis (AMS, persistently elevated lactic acidosis and possible pneumonia discovered on chest x-ray prompted concern for sepsis)   Critical care was time spent personally by me on  the following activities:  Development of treatment plan with patient or surrogate, discussions with consultants, evaluation of patient's response to treatment, examination of patient, ordering and review of laboratory studies, ordering and review of radiographic studies, ordering and performing treatments and interventions, pulse oximetry, re-evaluation of patient's condition and review of old charts    Medications Ordered in the ED  LORazepam  (ATIVAN ) injection 2 mg (has no administration in time range)  magnesium  sulfate IVPB 2 g 50 mL (2 g Intravenous New Bag/Given 05/15/24 1316)  azithromycin  (ZITHROMAX ) 500 mg in sodium chloride  0.9 % 250 mL IVPB (has no administration in time range)  lactated ringers  bolus 1,000 mL (0 mLs Intravenous Stopped 05/15/24 1110)  LORazepam  (ATIVAN ) injection 2 mg (2 mg Intravenous Given 05/15/24 1036)  haloperidol  lactate (HALDOL ) injection 5 mg (5 mg Intramuscular Given 05/15/24 1108)  diphenhydrAMINE  (BENADRYL ) injection 50 mg (50 mg Intramuscular Given  05/15/24 1110)  cefTRIAXone  (ROCEPHIN ) 1 g in sodium chloride  0.9 % 100 mL IVPB (1 g Intravenous New Bag/Given 05/15/24 1314)    Clinical Course as of 05/15/24 1358  Tue May 15, 2024  1054 Questionable: ibuprofen  overdose Poison control: cmp, mag, repeat EKG in 4 hours, repeat tylenol  and bmp in 4 hours. Unknown drug 24 hours obs and serial ekgs. Start is time of arrival. [place]. [JR]    Clinical Course User Index [JR] Lang Norleen POUR, PA-C                                 Medical Decision Making Amount and/or Complexity of Data Reviewed Labs: ordered. Radiology: ordered.  Risk Prescription drug management. Decision regarding hospitalization.   Initial Impression and Ddx 28 year old well-appearing male presenting for altered mental status.  Exam notable for confusion and diaphoresis.  DDx includes stroke, sepsis, metabolic encephalopathy, arrhythmia, anoxic brain injury, other.  Persistently confused and trying to leave the hospital this prompted chemical sedation with IV Ativan , IM Haldol  and Benadryl . Patient PMH that increases complexity of ED encounter:  history of migraines and asthma  Interpretation of Diagnostics - I independent reviewed and interpreted the labs as followed: Leukocytosis, elevated lactic acidosis, hypomagnesemia, hypokalemia  - I independently visualized the following imaging with scope of interpretation limited to determining acute life threatening conditions related to emergency care: CXR, which revealed: questionable mild hazy opacification/consolidation of the right lateral lung base, CT head normal  - I personally reviewed and interpreted EKG which revealed sinus rhythm, QTc is 433  Patient Reassessment and Ultimate Disposition/Management On reassessment, more calm and resting comfortably in bed.  Workup largely reassuring there was questions of possible pneumonia with persistent lactic acidosis and elevated white count, went ahead and treated with  ceftriaxone  and azithromycin .  Suspect however AMS is likely more related to drug intoxication.  Admitted to hospital service with Dr. Harrie.   Also discussed patient with poison control for questionable ibuprofen  overdose. They recommended: CMP, mag, repeat EKG in 4 hours, repeat tylenol  and bmp in 4 hours. Due to possible unknown drug ingestion recommended 24 hours obs and serial EKGs.   Patient management required discussion with the following services or consulting groups:  Hospitalist Service  Complexity of Problems Addressed Acute complicated illness or Injury  Additional Data Reviewed and Analyzed Further history obtained from: Past medical history and medications listed in the EMR and Prior ED visit notes  Patient Encounter Risk Assessment Consideration of hospitalization      Final diagnoses:  Altered mental status, unspecified altered mental status type    ED Discharge Orders     None          Lang Norleen POUR, PA-C 05/15/24 1414    Lang Norleen POUR, PA-C 05/15/24 1440    Long, Joshua G, MD 05/25/24 [phone]

## 2024-05-15 NOTE — ED Notes (Signed)
 Patient uncooperative and crawling out of bed, unable to follow commands. PA at bedside

## 2024-05-15 NOTE — Hospital Course (Addendum)
 Chris Hull is a 28 y.o. male with pertinent PMH of Asthma, Migraines who presented to [facility] ED for evaluation of altered mental status. He was admitted on 6/17 for acute encephalopathy with unclear cause and discharge on 6/18 after a full recovery and a negative workup.   Acute Encephalopathy unclear cause Combativeness, Confusion, Hypersomnolence Concern for toxic overdose Pt does not clearly recall the events that led to his hospitalization. The leading Babara Bolls is an unidentified toxic overdose such as contaminants of recreational marijuana, but that is not certain.   He was noted to be lethargic on the evening prior to his hospitalization. The next morning, he traveled to a local [facility] for relieve of a headache where he was observed to be confused, disoriented, and encouraged to remain  in place until EMS arrival. Thereafter, he was found to be hypotensive which corrected quickly with 1L fluid bolus en route to hospital. In ED, he was intermittently agitated vs hyper somnolent. Poison control was contacted due to concern for an ibuprofen  overdose (noted to have an empty box on his person, but thereafter found a full/unopened bottle). Nonetheless, their recommendations were followed for q4h BMP/EKG x 24 hours. Additional negative workup: - ethanol, ammonia, acetaminophen , salicylate, TSH, VBG, lipase, troponin, HIV, RPR, UA unremarkable - UDS + for THC and benzos (presumed from ED sedation) - Questionable pneumonia and maxillary sinus disease on imaging but not clinically supported. Imaging of head, chest, abdomen otherwise negative. Was treated with CTX and Zithromax  x 2 days through return to normal mental status.  After 24 hours of monitoring and supportive care the patient recovered fully. Our social workers found him a PCP and he will be discharged with follow up there.  EKG changes Hypomagnesemia Hypokalemia Initial EKG was NSR but a follow-up changed to irregular sinus rhythm with  evidence of an incomplete RBB, but then resolved therafter. K 3.2 and Mg 1.6 at admission were repleted - Replete electrolytes - troponins negative  Hyperbilirubinemia, unclear if isolated Chronic abdominal pain since Appendectomy in 2023 Family history gallbladder cancer in mother - KUB and RUQ US  for biliary disease unremarkable - Consider recheck of bilirubin at PCP   Hx of emotional distress Pt has lost many family members in recent years. He has two emotional support animals. His mother reports self-treatment with marijuana. This patient would likely benefit from emotional support resources, therapy, and medication therapy.

## 2024-05-15 NOTE — ED Notes (Signed)
 Gave update to [place] at poison control

## 2024-05-16 ENCOUNTER — Other Ambulatory Visit (HOSPITAL_COMMUNITY): Payer: Self-pay

## 2024-05-16 ENCOUNTER — Observation Stay (HOSPITAL_COMMUNITY)

## 2024-05-16 ENCOUNTER — Telehealth (HOSPITAL_COMMUNITY): Payer: Self-pay | Admitting: Emergency Medicine

## 2024-05-16 DIAGNOSIS — G9340 Encephalopathy, unspecified: Principal | ICD-10-CM

## 2024-05-16 DIAGNOSIS — Z79899 Other long term (current) drug therapy: Secondary | ICD-10-CM | POA: Diagnosis not present

## 2024-05-16 DIAGNOSIS — R9431 Abnormal electrocardiogram [ECG] [EKG]: Secondary | ICD-10-CM | POA: Diagnosis not present

## 2024-05-16 DIAGNOSIS — J45909 Unspecified asthma, uncomplicated: Secondary | ICD-10-CM | POA: Diagnosis not present

## 2024-05-16 LAB — BASIC METABOLIC PANEL WITH GFR
Anion gap: 9 (ref 5–15)
Anion gap: 8 (ref 5–15)
Anion gap: 6 (ref 5–15)
BUN: 6 mg/dL (ref 6–20)
BUN: 5 mg/dL — ABNORMAL LOW (ref 6–20)
BUN: 5 mg/dL — ABNORMAL LOW (ref 6–20)
CO2: 23 mmol/L (ref 22–32)
CO2: 21 mmol/L — ABNORMAL LOW (ref 22–32)
CO2: 22 mmol/L (ref 22–32)
Calcium: 9 mg/dL (ref 8.9–10.3)
Calcium: 8.7 mg/dL — ABNORMAL LOW (ref 8.9–10.3)
Calcium: 8.6 mg/dL — ABNORMAL LOW (ref 8.9–10.3)
Chloride: 105 mmol/L (ref 98–111)
Chloride: 107 mmol/L (ref 98–111)
Chloride: 108 mmol/L (ref 98–111)
Creatinine, Ser: 0.86 mg/dL (ref 0.61–1.24)
Creatinine, Ser: 1 mg/dL (ref 0.61–1.24)
Creatinine, Ser: 0.91 mg/dL (ref 0.61–1.24)
GFR, Estimated: >60 mL/min (ref >60)
GFR, Estimated: >60 mL/min (ref >60)
GFR, Estimated: >60 mL/min (ref >60)
Glucose, Bld: 97 mg/dL (ref 70–99)
Glucose, Bld: 123 mg/dL — ABNORMAL HIGH (ref 70–99)
Glucose, Bld: 101 mg/dL — ABNORMAL HIGH (ref 70–99)
Potassium: 3.4 mmol/L — ABNORMAL LOW (ref 3.5–5.1)
Potassium: 3.1 mmol/L — ABNORMAL LOW (ref 3.5–5.1)
Potassium: 3.9 mmol/L (ref 3.5–5.1)
Sodium: 137 mmol/L (ref 135–145)
Sodium: 136 mmol/L (ref 135–145)
Sodium: 136 mmol/L (ref 135–145)

## 2024-05-16 LAB — CBC
HCT: 41.5 % (ref 39.0–52.0)
Hemoglobin: 14.2 g/dL (ref 13.0–17.0)
MCH: 30.7 pg (ref 26.0–34.0)
MCHC: 34.2 g/dL (ref 30.0–36.0)
MCV: 89.8 fL (ref 80.0–100.0)
Platelets: 288 10*3/uL (ref 150–400)
RBC: 4.62 MIL/uL (ref 4.22–5.81)
RDW: 11.2 % — ABNORMAL LOW (ref 11.5–15.5)
WBC: 9.3 10*3/uL (ref 4.0–10.5)
nRBC: 0 % (ref 0.0–0.2)

## 2024-05-16 LAB — RPR: RPR Ser Ql: NONREACTIVE

## 2024-05-16 LAB — ECHOCARDIOGRAM COMPLETE
AR max vel: 3.62 cm2
AV Area VTI: 3.51 cm2
AV Area mean vel: 3.49 cm2
AV Mean grad: 4.5 mmHg
AV Peak grad: 8.9 mmHg
Ao pk vel: 1.49 m/s
Area-P 1/2: 3.6 cm2
S' Lateral: 3.4 cm

## 2024-05-16 LAB — MAGNESIUM: Magnesium: 1.9 mg/dL (ref 1.7–2.4)

## 2024-05-16 MED ORDER — ALBUTEROL 90 MCG/ACT IN AERS: 1.0000 | INHALATION_SPRAY | RESPIRATORY_TRACT | 0 refills | Status: AC | PRN

## 2024-05-16 MED ORDER — LORATADINE 10 MG PO TABS
10.0000 mg | ORAL_TABLET | Freq: Every day | ORAL | 0 refills | Status: AC | PRN
  Filled 2024-05-16: qty 10, 10d supply, fill #0

## 2024-05-16 MED ORDER — POTASSIUM CHLORIDE CRYS ER 20 MEQ PO TBCR
40.0000 meq | EXTENDED_RELEASE_TABLET | Freq: Once | ORAL | Status: AC
  Administered 2024-05-16: 40 meq via ORAL
  Filled 2024-05-16: qty 2

## 2024-05-16 MED ORDER — POTASSIUM CHLORIDE 20 MEQ PO PACK
40.0000 meq | PACK | Freq: Once | ORAL | Status: AC
  Administered 2024-05-16: 40 meq via ORAL
  Filled 2024-05-16: qty 2

## 2024-05-16 MED ORDER — FLUTICASONE FUROATE-VILANTEROL 200-25 MCG/ACT IN AEPB
1.0000 | INHALATION_SPRAY | Freq: Every day | RESPIRATORY_TRACT | Status: DC
  Administered 2024-05-16: 1 via RESPIRATORY_TRACT
  Filled 2024-05-16: qty 28

## 2024-05-16 MED ORDER — ALBUTEROL SULFATE HFA 108 (90 BASE) MCG/ACT IN AERS: 1.0000 | INHALATION_SPRAY | Freq: Four times a day (QID) | RESPIRATORY_TRACT | 0 refills | Status: DC | PRN

## 2024-05-16 MED ORDER — ALBUTEROL 90 MCG/ACT IN AERS: 1.0000 | INHALATION_SPRAY | RESPIRATORY_TRACT | 0 refills | Status: DC | PRN

## 2024-05-16 MED ORDER — ALBUTEROL SULFATE (2.5 MG/3ML) 0.083% IN NEBU: 2.5000 mg | INHALATION_SOLUTION | RESPIRATORY_TRACT | Status: DC | PRN

## 2024-05-16 MED ORDER — POTASSIUM CHLORIDE CRYS ER 20 MEQ PO TBCR
40.0000 meq | EXTENDED_RELEASE_TABLET | Freq: Two times a day (BID) | ORAL | Status: DC
  Administered 2024-05-16: 40 meq via ORAL
  Filled 2024-05-16: qty 2

## 2024-05-16 MED ORDER — BUDESONIDE-FORMOTEROL FUMARATE 160-4.5 MCG/ACT IN AERO
2.0000 | INHALATION_SPRAY | Freq: Every day | RESPIRATORY_TRACT | 0 refills | Status: AC | PRN
  Filled 2024-05-16: qty 10.2, 60d supply, fill #0

## 2024-05-16 MED ORDER — ALBUTEROL 90 MCG/ACT IN AERS: 1.0000 | INHALATION_SPRAY | RESPIRATORY_TRACT | Status: DC | PRN

## 2024-05-16 NOTE — TOC Transition Note (Signed)
 Transition of Care [facility]) - Discharge Note   Patient Details  Name: Chris Hull MRN: [id number] Date of Birth: Nov 06, 1996  Transition of [facility] CM/SW Contact:  Jonathan Neighbor, RN Phone Number: 05/16/2024, 1:26 PM   Clinical Narrative:     Pt is from home alone. His family is local and can check on him as needed.  Pt denies issues with transportation. He does state that he needs his asthma inhalers refilled. CM has updated the MD's.  No PCP. CM has placed PCP information on AVS. The office will contact him for the first appointment.  Pt has transportation home.  Final next level of care: Home/Self Care Barriers to Discharge: No Barriers Identified   Patient Goals and CMS Choice            Discharge Placement                       Discharge Plan and Services Additional resources added to the After Visit Summary for                                       Social Drivers of Health (SDOH) Interventions SDOH Screenings   Food Insecurity: No Food Insecurity (05/16/2024)  Housing: Unknown (05/16/2024)  Transportation Needs: No Transportation Needs (05/16/2024)  Utilities: Not At Risk (05/16/2024)  Social Connections: Unknown (04/10/2022)   Received from [facility]  Tobacco Use: Low Risk  (07/05/2022)     Readmission Risk Interventions     No data to display

## 2024-05-16 NOTE — Telephone Encounter (Signed)
 Needed script for albuterol. Just discharged

## 2024-05-16 NOTE — Discharge Summary (Signed)
 Name: Chris Hull MRN: [id number] DOB: 1996-10-20 28 y.o. PCP: Patient, No Pcp Per  Date of Admission: 05/15/2024  9:58 AM Date of Discharge:  05/16/24 Attending Physician: Dr. Broadus Canes  DISCHARGE DIAGNOSIS:  Primary Problem: Encephalopathy   Hospital Problems: Principal Problem:   Encephalopathy   DISCHARGE MEDICATIONS:   Allergies as of 05/16/2024       Reactions   Penicillins Hives        Medication List     TAKE these medications    albuterol 90 MCG/ACT inhaler Commonly known as: PROVENTIL,VENTOLIN Inhale 1-2 puffs into the lungs every 4 (four) hours as needed for shortness of breath or wheezing. What changed: reasons to take this   budesonide-formoterol 160-4.5 MCG/ACT inhaler Commonly known as: SYMBICORT Inhale 2 puffs into the lungs daily as needed (For shortness of breath or wheezing). What changed: reasons to take this   diphenhydramine-acetaminophen  25-500 MG Tabs tablet Commonly known as: TYLENOL  PM Take 1 tablet by mouth at bedtime as needed (for pain,sleep).   ibuprofen  200 MG tablet Commonly known as: ADVIL  Take 200 mg by mouth every 6 (six) hours as needed for moderate pain (pain score 4-6).   loratadine 10 MG tablet Commonly known as: CLARITIN Take 1 tablet (10 mg total) by mouth daily as needed for allergies.   mometasone 50 MCG/ACT nasal spray Commonly known as: NASONEX Place 2 sprays into the nose daily as needed (for allergies).        DISPOSITION AND FOLLOW-UP:  Mr.Azai Pietrzyk was discharged from [facility] in stable condition. At the hospital follow up visit please address:  Follow-up Recommendations: Labs: CMP - assess hyperbilirubinemia Medications: Consider antidepressant vs counseling for underlying mood issues. He is self treating with marijuana as a coping mechanism for many recent family losses.  Follow-up Appointments:  Follow-up Information     [facility] Family Follow up.   Why:  The office will contact you for an appointment Contact information: [address] [place], [place] [address]  Phone: [phone]                HOSPITAL COURSE:  Patient Summary: Aasir Daigler is a 28 y.o. male with pertinent PMH of Asthma, Migraines who presented to [facility] ED for evaluation of altered mental status. He was admitted on 6/17 for acute encephalopathy with unclear cause and discharge on 6/18 after a full recovery and a negative workup.   Acute Encephalopathy unclear cause Combativeness, Confusion, Hypersomnolence Concern for toxic overdose Pt does not clearly recall the events that led to his hospitalization. The leading Babara Bolls is an unidentified toxic overdose such as contaminants of recreational marijuana, but that is not certain.   He was noted to be lethargic on the evening prior to his hospitalization. The next morning, he traveled to a local [facility] for relieve of a headache where he was observed to be confused, disoriented, and encouraged to remain  in place until EMS arrival. Thereafter, he was found to be hypotensive which corrected quickly with 1L fluid bolus en route to hospital. In ED, he was intermittently agitated vs hyper somnolent. Poison control was contacted due to concern for an ibuprofen  overdose (noted to have an empty box on his person, but thereafter found a full/unopened bottle). Nonetheless, their recommendations were followed for q4h BMP/EKG x 24 hours. Additional negative workup: - ethanol, ammonia, acetaminophen , salicylate, TSH, VBG, lipase, troponin, HIV, RPR, UA unremarkable - UDS + for THC and benzos (presumed from ED sedation) - Questionable  pneumonia and maxillary sinus disease on imaging but not clinically supported. Imaging of head, chest, abdomen otherwise negative. Was treated with CTX and Zithromax  x 2 days through return to normal mental status.  After 24 hours of monitoring and supportive care the patient recovered fully. Our  social workers found him a PCP and he will be discharged with follow up there.  EKG changes Hypomagnesemia Hypokalemia Initial EKG was NSR but a follow-up changed to irregular sinus rhythm with evidence of an incomplete RBB, but then resolved therafter. K 3.2 and Mg 1.6 at admission were repleted - Replete electrolytes - troponins negative  Hyperbilirubinemia, unclear if isolated Chronic abdominal pain since Appendectomy in 2023 Family history gallbladder cancer in mother - KUB and RUQ US  for biliary disease unremarkable - Consider recheck of bilirubin at PCP   Hx of emotional distress Pt has lost many family members in recent years. He has two emotional support animals. His mother reports self-treatment with marijuana. This patient would likely benefit from emotional support resources, therapy, and medication therapy.   DISCHARGE INSTRUCTIONS:   Discharge Instructions     Call MD for:  difficulty breathing, headache or visual disturbances   Complete by: As directed    Call MD for:  extreme fatigue   Complete by: As directed    Call MD for:  persistant dizziness or light-headedness   Complete by: As directed    Call MD for:  persistant nausea and vomiting   Complete by: As directed    Call MD for:  temperature >100.4   Complete by: As directed    Discharge instructions   Complete by: As directed    Mr Schubring,  It is great to see you recover from your acute illness. You suffered from something called acute encephalopathy. The cause is unclear. It is possible that it was something that was consumed. To be safe, be careful with substances.   Plan to establish care with a primary doctor.  Reasons to seek immediate ER evaluation would be altered level of awareness/consciousness, confusion, severe dizzyness/lightheadedness, chest pain or irregular heart beats.   However, I think it is most likely that you will make a full recovery.   Increase activity slowly   Complete by: As  directed        SUBJECTIVE:  Feeling very well this morning - reports a full recovery. He is without acute concern of complaint. Does not fully remember the events of the last 36 hours. Discharge Vitals:   BP (!) 117/94 (BP Location: Right Arm)   Pulse 61   Temp 98 F (36.7 C)   Resp 18   SpO2 100%   OBJECTIVE:  Physical Exam Constitutional:      General: He is not in acute distress.    Appearance: He is not ill-appearing.  HENT:     Mouth/Throat:     Mouth: Mucous membranes are moist.     Pharynx: Oropharynx is clear.   Cardiovascular:     Rate and Rhythm: Normal rate and regular rhythm.     Pulses: Normal pulses.  Pulmonary:     Effort: Pulmonary effort is normal.     Breath sounds: Normal breath sounds.  Abdominal:     General: Abdomen is flat.     Palpations: Abdomen is soft.     Tenderness: There is no abdominal tenderness.   Musculoskeletal:     Right lower leg: No edema.     Left lower leg: No edema.   Skin:  General: Skin is warm and dry.   Neurological:     General: No focal deficit present.     Mental Status: He is alert and oriented to person, place, and time. Mental status is at baseline.     Motor: No weakness.   Psychiatric:        Mood and Affect: Mood normal.        Behavior: Behavior normal.      Pertinent Labs, Studies, and Procedures:     Latest Ref Rng & Units 05/16/2024    4:45 AM 05/15/2024    3:16 PM 05/15/2024   10:20 AM  CBC  WBC 4.0 - 10.5 K/uL 9.3  13.0  11.8   Hemoglobin 13.0 - 17.0 g/dL 14.7  82.9  56.2   Hematocrit 39.0 - 52.0 % 41.5  43.4  43.1   Platelets 150 - 400 K/uL 288  300  294        Latest Ref Rng & Units 05/16/2024    8:29 AM 05/16/2024    4:45 AM 05/15/2024   11:11 PM  CMP  Glucose 70 - 99 mg/dL 130  865  97   BUN 6 - 20 mg/dL 5  5  6    Creatinine 0.61 - 1.24 mg/dL 7.84  6.96  2.95   Sodium 135 - 145 mmol/L 136  136  137   Potassium 3.5 - 5.1 mmol/L 3.9  3.1  3.4   Chloride 98 - 111 mmol/L 108  107   105   CO2 22 - 32 mmol/L 22  21  23    Calcium 8.9 - 10.3 mg/dL 8.6  8.7  9.0     US  Abdomen Limited RUQ (LIVER/GB) Result Date: 05/15/2024 CLINICAL DATA:  Hyperbilirubinemia EXAM: ULTRASOUND ABDOMEN LIMITED RIGHT UPPER QUADRANT COMPARISON:  Abdomen pelvis CT 07/05/2022 FINDINGS: Gallbladder: No gallstones or wall thickening visualized. No sonographic Murphy sign noted by sonographer. Common bile duct: Diameter: 4 mm Liver: No focal lesion identified. Within normal limits in parenchymal echogenicity. Portal vein is patent on color Doppler imaging with normal direction of blood flow towards the liver. Other: None. IMPRESSION: No gallstones or ductal dilatation. Electronically Signed   By: Adrianna Horde M.D.   On: 05/15/2024 17:33   DG Abd 1 View Result Date: 05/15/2024 CLINICAL DATA:  Abdominal pain EXAM: ABDOMEN - 1 VIEW COMPARISON:  None Available. FINDINGS: Three supine frontal views of the abdomen and pelvis are obtained. No bowel obstruction or ileus. No masses or abnormal calcifications. Lung bases are clear. No acute bony abnormalities. IMPRESSION: 1. Unremarkable bowel gas pattern. Electronically Signed   By: Bobbye Burrow M.D.   On: 05/15/2024 16:15   DG Chest Port 1 View Result Date: 05/15/2024 CLINICAL DATA:  Questionable sepsis - evaluate for abnormality EXAM: PORTABLE CHEST 1 VIEW COMPARISON:  PA and lateral radiographs of the chest dated November 20, 2011. FINDINGS: The heart is normal in size and the pulmonary vasculature is unremarkable. There is a mild hazy appearance of the right lateral lung base. Lungs are clear otherwise. The bones are unremarkable. IMPRESSION: 1. Questionable mild hazy opacification/consolidation of the right lateral lung base. Electronically Signed   By: Maribeth Shivers M.D.   On: 05/15/2024 12:23   CT Head Wo Contrast Result Date: 05/15/2024 CLINICAL DATA:  Mental status change, unknown cause EXAM: CT HEAD WITHOUT CONTRAST TECHNIQUE: Contiguous axial images  were obtained from the base of the skull through the vertex without intravenous contrast. RADIATION DOSE REDUCTION: This exam was performed  according to the departmental dose-optimization program which includes automated exposure control, adjustment of the mA and/or kV according to patient size and/or use of iterative reconstruction technique. COMPARISON:  CT of the head dated June 12, 2013. FINDINGS: Brain: Normal brain. No evidence of hemorrhage, mass, cortical infarct or hydrocephalus. Vascular: Unremarkable. Skull: Intact and unremarkable. Sinuses/Orbits: Minimal mucosal disease within the maxillary sinuses. The orbits appear normal. Other: None. IMPRESSION: 1. Normal brain. 2. Maxillary sinus disease. Electronically Signed   By: Maribeth Shivers M.D.   On: 05/15/2024 12:21    Signed: Carleen Chary, DO Internal Medicine Resident, PGY-1 [facility]  2:26 PM, 05/16/2024

## 2024-05-16 NOTE — Progress Notes (Signed)
*  PRELIMINARY RESULTS* Echocardiogram 2D Echocardiogram has been performed.  Chris Hull 05/16/2024, 1:41 PM

## 2024-05-17 ENCOUNTER — Other Ambulatory Visit (HOSPITAL_COMMUNITY): Payer: Self-pay

## 2024-05-17 ENCOUNTER — Other Ambulatory Visit: Payer: Self-pay | Admitting: Student

## 2024-05-17 DIAGNOSIS — J45909 Unspecified asthma, uncomplicated: Secondary | ICD-10-CM

## 2024-05-17 MED ORDER — ALBUTEROL SULFATE HFA 108 (90 BASE) MCG/ACT IN AERS
1.0000 | INHALATION_SPRAY | Freq: Four times a day (QID) | RESPIRATORY_TRACT | 2 refills | Status: AC | PRN
Start: 2024-05-17 — End: ?
  Filled 2024-05-17: qty 18, 20d supply, fill #0

## 2024-05-18 LAB — CULTURE, BLOOD (ROUTINE X 2)

## 2024-05-20 LAB — CULTURE, BLOOD (ROUTINE X 2)
Culture: NO GROWTH
Culture: NO GROWTH
Special Requests: ADEQUATE
Special Requests: ADEQUATE
